# Patient Record
Sex: Female | Born: 1965 | Race: White | Hispanic: No | Marital: Married | Smoking: Never smoker
Health system: Southern US, Community
[De-identification: ages and names within clinical notes are randomized; demographics above are authoritative.]

## PROBLEM LIST (undated history)

## (undated) DIAGNOSIS — J45909 Unspecified asthma, uncomplicated: Secondary | ICD-10-CM

## (undated) DIAGNOSIS — E669 Obesity, unspecified: Secondary | ICD-10-CM

## (undated) DIAGNOSIS — R053 Chronic cough: Secondary | ICD-10-CM

## (undated) DIAGNOSIS — D259 Leiomyoma of uterus, unspecified: Secondary | ICD-10-CM

## (undated) DIAGNOSIS — K219 Gastro-esophageal reflux disease without esophagitis: Secondary | ICD-10-CM

## (undated) DIAGNOSIS — Z8719 Personal history of other diseases of the digestive system: Secondary | ICD-10-CM

## (undated) DIAGNOSIS — E785 Hyperlipidemia, unspecified: Secondary | ICD-10-CM

---

## 2008-04-22 ENCOUNTER — Other Ambulatory Visit: Payer: Self-pay

## 2008-04-22 ENCOUNTER — Emergency Department: Payer: Self-pay | Admitting: Emergency Medicine

## 2009-02-27 ENCOUNTER — Ambulatory Visit: Payer: Self-pay

## 2010-10-07 ENCOUNTER — Ambulatory Visit: Payer: Self-pay

## 2011-10-26 ENCOUNTER — Ambulatory Visit: Payer: Self-pay | Admitting: Obstetrics and Gynecology

## 2011-10-29 ENCOUNTER — Ambulatory Visit: Payer: Self-pay | Admitting: Obstetrics and Gynecology

## 2013-01-10 ENCOUNTER — Ambulatory Visit: Payer: Self-pay | Admitting: Obstetrics and Gynecology

## 2015-10-07 ENCOUNTER — Other Ambulatory Visit: Payer: Self-pay | Admitting: Obstetrics and Gynecology

## 2015-10-07 DIAGNOSIS — Z1239 Encounter for other screening for malignant neoplasm of breast: Secondary | ICD-10-CM

## 2016-03-03 ENCOUNTER — Other Ambulatory Visit: Payer: Self-pay | Admitting: Obstetrics and Gynecology

## 2016-03-03 DIAGNOSIS — Z1239 Encounter for other screening for malignant neoplasm of breast: Secondary | ICD-10-CM

## 2016-03-16 ENCOUNTER — Ambulatory Visit
Admission: RE | Admit: 2016-03-16 | Discharge: 2016-03-16 | Disposition: A | Discharge status: Home / Self Care | Payer: BLUE CROSS/BLUE SHIELD | Source: Ambulatory Visit | Attending: Obstetrics and Gynecology | Admitting: Obstetrics and Gynecology

## 2016-03-16 DIAGNOSIS — Z1239 Encounter for other screening for malignant neoplasm of breast: Secondary | ICD-10-CM

## 2016-03-16 DIAGNOSIS — Z1231 Encounter for screening mammogram for malignant neoplasm of breast: Secondary | ICD-10-CM | POA: Insufficient documentation

## 2016-10-20 ENCOUNTER — Other Ambulatory Visit: Payer: Self-pay | Admitting: Obstetrics and Gynecology

## 2016-10-20 DIAGNOSIS — Z1231 Encounter for screening mammogram for malignant neoplasm of breast: Secondary | ICD-10-CM

## 2016-12-27 HISTORY — PX: COLONOSCOPY WITH ESOPHAGOGASTRODUODENOSCOPY (EGD): SHX5779

## 2017-03-22 ENCOUNTER — Ambulatory Visit
Admission: RE | Admit: 2017-03-22 | Discharge: 2017-03-22 | Disposition: A | Discharge status: Home / Self Care | Payer: BLUE CROSS/BLUE SHIELD | Source: Ambulatory Visit | Attending: Obstetrics and Gynecology | Admitting: Obstetrics and Gynecology

## 2017-03-22 DIAGNOSIS — Z1231 Encounter for screening mammogram for malignant neoplasm of breast: Secondary | ICD-10-CM | POA: Diagnosis not present

## 2017-09-26 HISTORY — PX: COLONOSCOPY: SHX174

## 2018-04-12 ENCOUNTER — Other Ambulatory Visit: Payer: Self-pay | Admitting: Obstetrics and Gynecology

## 2018-04-12 DIAGNOSIS — Z1231 Encounter for screening mammogram for malignant neoplasm of breast: Secondary | ICD-10-CM

## 2018-05-09 ENCOUNTER — Ambulatory Visit
Admission: RE | Admit: 2018-05-09 | Discharge: 2018-05-09 | Disposition: A | Discharge status: Home / Self Care | Payer: BLUE CROSS/BLUE SHIELD | Source: Ambulatory Visit | Attending: Obstetrics and Gynecology | Admitting: Obstetrics and Gynecology

## 2018-05-09 DIAGNOSIS — Z1231 Encounter for screening mammogram for malignant neoplasm of breast: Secondary | ICD-10-CM

## 2019-07-12 ENCOUNTER — Other Ambulatory Visit: Payer: Self-pay | Admitting: Nurse Practitioner

## 2019-07-12 DIAGNOSIS — Z1231 Encounter for screening mammogram for malignant neoplasm of breast: Secondary | ICD-10-CM

## 2019-07-31 ENCOUNTER — Ambulatory Visit
Admission: RE | Admit: 2019-07-31 | Discharge: 2019-07-31 | Disposition: A | Discharge status: Home / Self Care | Payer: 59 | Source: Ambulatory Visit | Attending: Nurse Practitioner | Admitting: Nurse Practitioner

## 2019-07-31 ENCOUNTER — Other Ambulatory Visit: Payer: Self-pay

## 2019-07-31 ENCOUNTER — Encounter (INDEPENDENT_AMBULATORY_CARE_PROVIDER_SITE_OTHER): Payer: Self-pay

## 2019-07-31 DIAGNOSIS — Z1231 Encounter for screening mammogram for malignant neoplasm of breast: Secondary | ICD-10-CM | POA: Diagnosis present

## 2020-07-08 ENCOUNTER — Other Ambulatory Visit: Payer: Self-pay | Admitting: Nurse Practitioner

## 2020-07-08 DIAGNOSIS — Z1231 Encounter for screening mammogram for malignant neoplasm of breast: Secondary | ICD-10-CM

## 2020-08-12 ENCOUNTER — Other Ambulatory Visit: Payer: Self-pay

## 2020-08-12 ENCOUNTER — Ambulatory Visit
Admission: RE | Admit: 2020-08-12 | Discharge: 2020-08-12 | Disposition: A | Discharge status: Home / Self Care | Payer: 59 | Source: Ambulatory Visit | Attending: Nurse Practitioner | Admitting: Nurse Practitioner

## 2020-08-12 DIAGNOSIS — Z1231 Encounter for screening mammogram for malignant neoplasm of breast: Secondary | ICD-10-CM | POA: Diagnosis present

## 2021-05-19 ENCOUNTER — Other Ambulatory Visit: Payer: Self-pay | Admitting: Obstetrics and Gynecology

## 2021-06-17 ENCOUNTER — Other Ambulatory Visit: Payer: 59

## 2021-06-25 NOTE — H&P (Signed)
Joyce Stevenson is a 55 y.o. female here for PMB Scheduled for a TVH and bilateral salpingectomy .Marland Kitchen Full workup in 10/21  Negative SIS except for small fibroids   embx 9/21 - negative except for polyps  Pap 9/24 - neg  Spotted in 03/2021 and then in 04/26/21 start heavy bleeding ++ clots  G1P1 s/p svd   Past Medical History:  has a past medical history of Allergic rhinitis, Chronic cough, Onychomycosis, and Uterine fibroid (06/2020).  Past Surgical History:  has a past surgical history that includes Colonoscopy (01/11/2017); egd (01/11/2017); and Colonoscopy (10/18/2017). Family History: family history includes Breast cancer in her maternal aunt and maternal grandmother; High blood pressure (Hypertension) in her father and mother; Lung cancer in her father; Reflux disease in her father and mother. Social History:  reports that she has never smoked. She has never used smokeless tobacco. She reports that she does not drink alcohol and does not use drugs. OB/GYN History:          OB History     Gravida  1   Para  1   Term      Preterm      AB      Living  1      SAB      IAB      Ectopic      Molar      Multiple      Live Births  1             Allergies: has No Known Allergies. Medications:   Current Outpatient Medications:    esomeprazole (NEXIUM) 40 MG DR capsule, Take 1 capsule (40 mg total) by mouth once daily, Disp: 90 capsule, Rfl: 3   Lactobacillus acidophilus (PROBIOTIC ORAL), Take 1 capsule by mouth sometimes  , Disp: , Rfl:    multivitamin tablet, Take 1 tablet by mouth once daily, Disp: , Rfl:    tranexamic acid (LYSTEDA) 650 mg tablet, Take 2 tablets (1,300 mg total) by mouth 3 (three) times daily Take for a maximum of 5 days during monthly menstruation., Disp: 30 tablet, Rfl: 3   Review of Systems: General:                      No fatigue or weight loss Eyes:                           No vision changes Ears:                            No hearing  difficulty Respiratory:                No cough or shortness of breath Pulmonary:                  No asthma or shortness of breath Cardiovascular:           No chest pain, palpitations, dyspnea on exertion Gastrointestinal:          No abdominal bloating, chronic diarrhea, constipations, masses, pain or hematochezia Genitourinary:             No hematuria, dysuria, abnormal vaginal discharge, pelvic pain, Menometrorrhagia Lymphatic:                   No swollen lymph nodes Musculoskeletal:         No  muscle weakness Neurologic:                  No extremity weakness, syncope, seizure disorder Psychiatric:                  No history of depression, delusions or suicidal/homicidal ideation      Exam:       Vitals:  06/25/21   BP: (!) 147/84  Pulse: 84      Body mass index is 33.73 kg/m.   WDWN white/  female in NAD   Lungs: CTA  CV : RRR without murmur   Neck:  no thyromegaly Abdomen: soft , no mass, normal active bowel sounds,  non-tender, no rebound tenderness Pelvic: tanner stage 5 ,  External genitalia: vulva /labia no lesions Urethra: no prolapse Vagina: bloody d/c  Clots at cervical os  Adequate room for TVH , cx descend 1/2 down canal Cervix: no lesions, no cervical motion tenderness   Uterus: normal size shape and contour, non-tender Adnexa: no mass,  non-tender   Rectovaginal   Impression:    The primary encounter diagnosis was PMB (postmenopausal bleeding). A diagnosis of Menorrhagia with irregular cycle was also pertinent to this visit.       Plan:  Offered Fx D+C  With H/S and endometrial ablation . Pt elects for definitve sx TVH and bilat salpingectomy  I have explained the procedure and possible risks .  For now she with start Lysteda 1300 mg tid x 5 days               Caroline Sauger, MD

## 2021-07-02 ENCOUNTER — Other Ambulatory Visit: Payer: Self-pay | Admitting: Nurse Practitioner

## 2021-07-06 ENCOUNTER — Other Ambulatory Visit: Payer: Self-pay

## 2021-07-06 ENCOUNTER — Encounter
Admission: RE | Admit: 2021-07-06 | Discharge: 2021-07-06 | Disposition: A | Discharge status: Home / Self Care | Payer: Managed Care, Other (non HMO) | Source: Ambulatory Visit | Attending: Obstetrics and Gynecology | Admitting: Obstetrics and Gynecology

## 2021-07-06 HISTORY — DX: Leiomyoma of uterus, unspecified: D25.9

## 2021-07-06 NOTE — Patient Instructions (Addendum)
Your procedure is scheduled on: Wednesday, July 20 Report to the Registration Desk on the 1st floor of the [facility]. To find out your arrival time, please call [phone] between 1PM - 3PM on: Tuesday, July 19  REMEMBER: Instructions that are not followed completely may result in serious medical risk, up to and including death; or upon the discretion of your surgeon and anesthesiologist your surgery may need to be rescheduled.  Do not eat food after midnight the night before surgery.  No gum chewing, lozengers or hard candies.  You may however, drink CLEAR liquids up to 2 hours before you are scheduled to arrive for your surgery. Do not drink anything within 2 hours of your scheduled arrival time.  Clear liquids include: - water  - apple juice without pulp - gatorade (not RED, PURPLE, OR BLUE) - black coffee or tea (Do NOT add milk or creamers to the coffee or tea) Do NOT drink anything that is not on this list.  In addition, your doctor has ordered for you to drink the provided  Ensure Pre-Surgery Clear Carbohydrate Drink  Drinking this carbohydrate drink up to two hours before surgery helps to reduce insulin resistance and improve patient outcomes. Please complete drinking 2 hours prior to scheduled arrival time.  DO NOT TAKE ANY MEDICATIONS THE MORNING OF SURGERY  One week prior to surgery: STARTING July 13 Stop Anti-inflammatories (NSAIDS) such as Advil, Aleve, Ibuprofen, Motrin, Naproxen, Naprosyn and Aspirin based products such as Excedrin, Goodys Powder, BC Powder. Stop ANY OVER THE COUNTER supplements until after surgery. (STOP GINKGO, MULTI-VITAMIN, VITAMIN D) You may however, continue to take Tylenol if needed for pain up until the day of surgery.  No Alcohol for 24 hours before or after surgery.  No Smoking including e-cigarettes for 24 hours prior to surgery.  No chewable tobacco products for at least 6 hours prior to surgery.  No nicotine patches on the day of  surgery.  Do not use any "recreational" drugs for at least a week prior to your surgery.  Please be advised that the combination of cocaine and anesthesia may have negative outcomes, up to and including death. If you test positive for cocaine, your surgery will be cancelled.  On the morning of surgery brush your teeth with toothpaste and water, you may rinse your mouth with mouthwash if you wish. Do not swallow any toothpaste or mouthwash.  Do not wear jewelry, make-up, hairpins, clips or nail polish.  Do not wear lotions, powders, or perfumes.   Do not shave body from the neck down 48 hours prior to surgery just in case you cut yourself which could leave a site for infection.  Also, freshly shaved skin may become irritated if using the CHG soap.  Contact lenses, hearing aids and dentures may not be worn into surgery.  Do not bring valuables to the hospital. [facility] is not responsible for any missing/lost belongings or valuables.   Use CHG Soap as directed on instruction sheet.  Notify your doctor if there is any change in your medical condition (cold, fever, infection).  Wear comfortable clothing (specific to your surgery type) to the hospital.  After surgery, you can help prevent lung complications by doing breathing exercises.  Take deep breaths and cough every 1-2 hours. Your doctor may order a device called an Incentive Spirometer to help you take deep breaths. When coughing or sneezing, hold a pillow firmly against your incision with both hands. This is called "splinting." Doing this  helps protect your incision. It also decreases belly discomfort.  If you are being discharged the day of surgery, you will not be allowed to drive home. You will need a responsible adult (18 years or older) to drive you home and stay with you that night.   If you are taking public transportation, you will need to have a responsible adult (18 years or older) with you. Please confirm with your  physician that it is acceptable to use public transportation.   Please call the [place] Dept[facility] at [phone] if you have any questions about these instructions.  Surgery Visitation Policy:  Patients undergoing a surgery or procedure may have one family member or support person with them as long as that person is not COVID-19 positive or experiencing its symptoms.  That person may remain in the waiting area during the procedure.

## 2021-07-07 ENCOUNTER — Encounter
Admission: RE | Admit: 2021-07-07 | Discharge: 2021-07-07 | Disposition: A | Discharge status: Home / Self Care | Payer: Managed Care, Other (non HMO) | Source: Ambulatory Visit | Attending: Obstetrics and Gynecology | Admitting: Obstetrics and Gynecology

## 2021-07-07 DIAGNOSIS — Z01812 Encounter for preprocedural laboratory examination: Secondary | ICD-10-CM | POA: Diagnosis not present

## 2021-07-07 LAB — CBC
HCT: 44.2 % (ref 36.0–46.0)
Hemoglobin: 15.6 g/dL — ABNORMAL HIGH (ref 12.0–15.0)
MCH: 31.6 pg (ref 26.0–34.0)
MCHC: 35.3 g/dL (ref 30.0–36.0)
MCV: 89.5 fL (ref 80.0–100.0)
Platelets: 176 10*3/uL (ref 150–400)
RBC: 4.94 MIL/uL (ref 3.87–5.11)
RDW: 11.5 % (ref 11.5–15.5)
WBC: 5.8 10*3/uL (ref 4.0–10.5)
nRBC: 0 % (ref 0.0–0.2)

## 2021-07-07 LAB — TYPE AND SCREEN
ABO/RH(D): B POS
Antibody Screen: NEGATIVE

## 2021-07-07 LAB — BASIC METABOLIC PANEL
Anion gap: 7 (ref 5–15)
BUN: 12 mg/dL (ref 6–20)
CO2: 26 mmol/L (ref 22–32)
Calcium: 9.6 mg/dL (ref 8.9–10.3)
Chloride: 106 mmol/L (ref 98–111)
Creatinine, Ser: 0.88 mg/dL (ref 0.44–1.00)
GFR, Estimated: >60 mL/min (ref >60)
Glucose, Bld: 118 mg/dL — ABNORMAL HIGH (ref 70–99)
Potassium: 3.8 mmol/L (ref 3.5–5.1)
Sodium: 139 mmol/L (ref 135–145)

## 2021-07-10 ENCOUNTER — Other Ambulatory Visit: Payer: Self-pay | Admitting: Nurse Practitioner

## 2021-07-10 DIAGNOSIS — Z1231 Encounter for screening mammogram for malignant neoplasm of breast: Secondary | ICD-10-CM

## 2021-07-14 MED ORDER — ORAL CARE MOUTH RINSE: 15.0000 mL | Freq: Once | OROMUCOSAL | Status: DC

## 2021-07-14 MED ORDER — FAMOTIDINE 20 MG PO TABS
20.0000 mg | ORAL_TABLET | Freq: Once | ORAL | Status: AC
  Administered 2021-07-15: 20 mg via ORAL

## 2021-07-14 MED ORDER — ACETAMINOPHEN 500 MG PO TABS
1000.0000 mg | ORAL_TABLET | ORAL | Status: AC
  Administered 2021-07-15: 1000 mg via ORAL

## 2021-07-14 MED ORDER — POVIDONE-IODINE 10 % EX SWAB: 2.0000 "application " | Freq: Once | CUTANEOUS | Status: DC

## 2021-07-14 MED ORDER — CEFAZOLIN SODIUM-DEXTROSE 2-4 GM/100ML-% IV SOLN
2.0000 g | Freq: Once | INTRAVENOUS | Status: AC
  Administered 2021-07-15: 2 g via INTRAVENOUS

## 2021-07-14 MED ORDER — CHLORHEXIDINE GLUCONATE 0.12 % MT SOLN: 15.0000 mL | Freq: Once | OROMUCOSAL | Status: DC

## 2021-07-14 MED ORDER — GABAPENTIN 300 MG PO CAPS
300.0000 mg | ORAL_CAPSULE | ORAL | Status: AC
  Administered 2021-07-15: 300 mg via ORAL

## 2021-07-15 ENCOUNTER — Encounter
Admission: RE | Disposition: A | Discharge status: Home / Self Care | Payer: Self-pay | Source: Home / Self Care | Attending: Obstetrics and Gynecology

## 2021-07-15 ENCOUNTER — Other Ambulatory Visit: Payer: Self-pay

## 2021-07-15 ENCOUNTER — Ambulatory Visit: Payer: Managed Care, Other (non HMO) | Admitting: Anesthesiology

## 2021-07-15 ENCOUNTER — Ambulatory Visit
Admission: RE | Admit: 2021-07-15 | Discharge: 2021-07-15 | Disposition: A | Discharge status: Home / Self Care | Payer: Managed Care, Other (non HMO) | Attending: Obstetrics and Gynecology | Admitting: Obstetrics and Gynecology

## 2021-07-15 ENCOUNTER — Encounter: Payer: Self-pay | Admitting: Obstetrics and Gynecology

## 2021-07-15 DIAGNOSIS — N800 Endometriosis of uterus: Secondary | ICD-10-CM | POA: Insufficient documentation

## 2021-07-15 DIAGNOSIS — N950 Postmenopausal bleeding: Secondary | ICD-10-CM | POA: Diagnosis present

## 2021-07-15 DIAGNOSIS — D251 Intramural leiomyoma of uterus: Secondary | ICD-10-CM | POA: Diagnosis not present

## 2021-07-15 HISTORY — PX: BILATERAL SALPINGECTOMY: SHX5743

## 2021-07-15 HISTORY — PX: VAGINAL HYSTERECTOMY: SHX2639

## 2021-07-15 LAB — ABO/RH: ABO/RH(D): B POS

## 2021-07-15 LAB — POCT PREGNANCY, URINE: Preg Test, Ur: NEGATIVE

## 2021-07-15 SURGERY — HYSTERECTOMY, VAGINAL
Anesthesia: General

## 2021-07-15 MED ORDER — FENTANYL CITRATE (PF) 100 MCG/2ML IJ SOLN
INTRAMUSCULAR | Status: AC
  Filled 2021-07-15: qty 2

## 2021-07-15 MED ORDER — OXYCODONE-ACETAMINOPHEN 5-325 MG PO TABS: 1.0000 | ORAL_TABLET | ORAL | Status: DC | PRN

## 2021-07-15 MED ORDER — PHENYLEPHRINE HCL (PRESSORS) 10 MG/ML IV SOLN
INTRAVENOUS | Status: AC
  Filled 2021-07-15: qty 1

## 2021-07-15 MED ORDER — MIDAZOLAM HCL 2 MG/2ML IJ SOLN
INTRAMUSCULAR | Status: DC | PRN
  Administered 2021-07-15: 2 mg via INTRAVENOUS

## 2021-07-15 MED ORDER — DEXMEDETOMIDINE (PRECEDEX) IN NS 20 MCG/5ML (4 MCG/ML) IV SYRINGE
PREFILLED_SYRINGE | INTRAVENOUS | Status: DC | PRN
  Administered 2021-07-15: 8 ug via INTRAVENOUS

## 2021-07-15 MED ORDER — PROMETHAZINE HCL 25 MG/ML IJ SOLN
6.2500 mg | INTRAMUSCULAR | Status: DC | PRN
  Administered 2021-07-15: 12.5 mg via INTRAVENOUS

## 2021-07-15 MED ORDER — OXYCODONE HCL 5 MG PO TABS
5.0000 mg | ORAL_TABLET | Freq: Once | ORAL | Status: DC | PRN
Start: 2021-07-15 — End: 2021-07-15

## 2021-07-15 MED ORDER — PROPOFOL 10 MG/ML IV BOLUS
INTRAVENOUS | Status: DC | PRN
  Administered 2021-07-15: 50 mg via INTRAVENOUS
  Administered 2021-07-15: 200 mg via INTRAVENOUS

## 2021-07-15 MED ORDER — CHLORHEXIDINE GLUCONATE 0.12 % MT SOLN
OROMUCOSAL | Status: AC
  Filled 2021-07-15: qty 15

## 2021-07-15 MED ORDER — ONDANSETRON HCL 4 MG/2ML IJ SOLN
INTRAMUSCULAR | Status: DC | PRN
  Administered 2021-07-15: 4 mg via INTRAVENOUS

## 2021-07-15 MED ORDER — 0.9 % SODIUM CHLORIDE (POUR BTL) OPTIME
TOPICAL | Status: DC | PRN
  Administered 2021-07-15: 500 mL

## 2021-07-15 MED ORDER — PROMETHAZINE HCL 25 MG/ML IJ SOLN
INTRAMUSCULAR | Status: AC
  Filled 2021-07-15: qty 1

## 2021-07-15 MED ORDER — DEXAMETHASONE SODIUM PHOSPHATE 10 MG/ML IJ SOLN
INTRAMUSCULAR | Status: AC
  Filled 2021-07-15: qty 1

## 2021-07-15 MED ORDER — ACETAMINOPHEN 500 MG PO TABS
ORAL_TABLET | ORAL | Status: AC
  Filled 2021-07-15: qty 2

## 2021-07-15 MED ORDER — PROPOFOL 1000 MG/100ML IV EMUL
INTRAVENOUS | Status: AC
  Filled 2021-07-15: qty 100

## 2021-07-15 MED ORDER — CEFAZOLIN SODIUM-DEXTROSE 2-4 GM/100ML-% IV SOLN
INTRAVENOUS | Status: AC
  Filled 2021-07-15: qty 100

## 2021-07-15 MED ORDER — LIDOCAINE HCL (CARDIAC) PF 100 MG/5ML IV SOSY
PREFILLED_SYRINGE | INTRAVENOUS | Status: DC | PRN
  Administered 2021-07-15: 100 mg via INTRAVENOUS

## 2021-07-15 MED ORDER — FAMOTIDINE 20 MG PO TABS
ORAL_TABLET | ORAL | Status: AC
  Filled 2021-07-15: qty 1

## 2021-07-15 MED ORDER — DEXAMETHASONE SODIUM PHOSPHATE 10 MG/ML IJ SOLN
INTRAMUSCULAR | Status: DC | PRN
  Administered 2021-07-15: 10 mg via INTRAVENOUS

## 2021-07-15 MED ORDER — SUGAMMADEX SODIUM 200 MG/2ML IV SOLN
INTRAVENOUS | Status: DC | PRN
  Administered 2021-07-15: 200 mg via INTRAVENOUS

## 2021-07-15 MED ORDER — LIDOCAINE-EPINEPHRINE 1 %-1:100000 IJ SOLN
INTRAMUSCULAR | Status: DC | PRN
  Administered 2021-07-15: 7 mL

## 2021-07-15 MED ORDER — ONDANSETRON HCL 4 MG/2ML IJ SOLN
INTRAMUSCULAR | Status: AC
  Filled 2021-07-15: qty 2

## 2021-07-15 MED ORDER — OXYCODONE-ACETAMINOPHEN 5-325 MG PO TABS
ORAL_TABLET | ORAL | Status: AC
  Administered 2021-07-15: 2 via ORAL
  Filled 2021-07-15: qty 2

## 2021-07-15 MED ORDER — ROCURONIUM BROMIDE 100 MG/10ML IV SOLN
INTRAVENOUS | Status: DC | PRN
  Administered 2021-07-15: 40 mg via INTRAVENOUS

## 2021-07-15 MED ORDER — ONDANSETRON 4 MG PO TBDP
4.0000 mg | ORAL_TABLET | Freq: Four times a day (QID) | ORAL | Status: DC | PRN
Start: 2021-07-15 — End: 2021-07-15

## 2021-07-15 MED ORDER — LIDOCAINE-EPINEPHRINE 1 %-1:100000 IJ SOLN
INTRAMUSCULAR | Status: AC
  Filled 2021-07-15: qty 1

## 2021-07-15 MED ORDER — OXYCODONE HCL 5 MG/5ML PO SOLN: 5.0000 mg | Freq: Once | ORAL | Status: DC | PRN

## 2021-07-15 MED ORDER — SODIUM CHLORIDE FLUSH 0.9 % IV SOLN
INTRAVENOUS | Status: AC
  Filled 2021-07-15: qty 10

## 2021-07-15 MED ORDER — MIDAZOLAM HCL 2 MG/2ML IJ SOLN
INTRAMUSCULAR | Status: AC
  Filled 2021-07-15: qty 2

## 2021-07-15 MED ORDER — FENTANYL CITRATE (PF) 100 MCG/2ML IJ SOLN
INTRAMUSCULAR | Status: DC | PRN
  Administered 2021-07-15 (×2): 50 ug via INTRAVENOUS

## 2021-07-15 MED ORDER — ROCURONIUM BROMIDE 10 MG/ML (PF) SYRINGE
PREFILLED_SYRINGE | INTRAVENOUS | Status: AC
  Filled 2021-07-15: qty 10

## 2021-07-15 MED ORDER — FENTANYL CITRATE (PF) 100 MCG/2ML IJ SOLN
25.0000 ug | INTRAMUSCULAR | Status: DC | PRN
  Administered 2021-07-15: 25 ug via INTRAVENOUS

## 2021-07-15 MED ORDER — LACTATED RINGERS IV SOLN: INTRAVENOUS | Status: DC

## 2021-07-15 MED ORDER — GABAPENTIN 300 MG PO CAPS
ORAL_CAPSULE | ORAL | Status: AC
  Filled 2021-07-15: qty 1

## 2021-07-15 MED ORDER — LIDOCAINE HCL (PF) 2 % IJ SOLN
INTRAMUSCULAR | Status: AC
  Filled 2021-07-15: qty 5

## 2021-07-15 MED ORDER — OXYCODONE HCL 5 MG PO TABS
ORAL_TABLET | ORAL | Status: AC
  Filled 2021-07-15: qty 1

## 2021-07-15 SURGICAL SUPPLY — 41 items
BACTOSHIELD CHG 4% 4OZ (MISCELLANEOUS) ×1
BAG DRN RND TRDRP ANRFLXCHMBR (UROLOGICAL SUPPLIES) ×2
BAG URINE DRAIN 2000ML AR STRL (UROLOGICAL SUPPLIES) ×3 IMPLANT
CANISTER SUCT 1200ML W/VALVE (MISCELLANEOUS) ×3 IMPLANT
CATH FOLEY 2WAY  5CC 16FR (CATHETERS) ×1
CATH FOLEY 2WAY 5CC 16FR (CATHETERS) ×2
CATH ROBINSON RED A/P 16FR (CATHETERS) ×3 IMPLANT
CATH URTH 16FR FL 2W BLN LF (CATHETERS) ×2 IMPLANT
DRAPE PERI LITHO V/GYN (MISCELLANEOUS) ×3 IMPLANT
DRAPE SURG 17X11 SM STRL (DRAPES) ×3 IMPLANT
DRAPE UNDER BUTTOCK W/FLU (DRAPES) ×3 IMPLANT
ELECT REM PT RETURN 9FT ADLT (ELECTROSURGICAL) ×3
ELECTRODE REM PT RTRN 9FT ADLT (ELECTROSURGICAL) ×2 IMPLANT
GAUZE 4X4 16PLY ~~LOC~~+RFID DBL (SPONGE) ×9 IMPLANT
GLOVE SURG SYN 8.0 (GLOVE) ×3 IMPLANT
GOWN STRL REUS W/ TWL LRG LVL3 (GOWN DISPOSABLE) ×6 IMPLANT
GOWN STRL REUS W/ TWL XL LVL3 (GOWN DISPOSABLE) ×2 IMPLANT
GOWN STRL REUS W/TWL LRG LVL3 (GOWN DISPOSABLE) ×9
GOWN STRL REUS W/TWL XL LVL3 (GOWN DISPOSABLE) ×3
KIT PINK PAD W/HEAD ARE REST (MISCELLANEOUS) ×3
KIT PINK PAD W/HEAD ARM REST (MISCELLANEOUS) ×2 IMPLANT
KIT TURNOVER CYSTO (KITS) ×3 IMPLANT
LABEL OR SOLS (LABEL) ×3 IMPLANT
MANIFOLD NEPTUNE II (INSTRUMENTS) ×3 IMPLANT
NEEDLE HYPO 22GX1.5 SAFETY (NEEDLE) ×3 IMPLANT
NEEDLE HYPO 25X1 1.5 SAFETY (NEEDLE) ×3 IMPLANT
PACK BASIN MINOR ARMC (MISCELLANEOUS) ×3 IMPLANT
PAD OB MATERNITY 4.3X12.25 (PERSONAL CARE ITEMS) ×3 IMPLANT
PAD PREP 24X41 OB/GYN DISP (PERSONAL CARE ITEMS) ×3 IMPLANT
SCRUB CHG 4% DYNA-HEX 4OZ (MISCELLANEOUS) ×2 IMPLANT
SOL PREP PVP 2OZ (MISCELLANEOUS) ×3
SOLUTION PREP PVP 2OZ (MISCELLANEOUS) ×2 IMPLANT
SURGILUBE 2OZ TUBE FLIPTOP (MISCELLANEOUS) ×3 IMPLANT
SUT VIC AB 0 CT1 27 (SUTURE) ×9
SUT VIC AB 0 CT1 27XCR 8 STRN (SUTURE) ×6 IMPLANT
SUT VIC AB 0 CT1 36 (SUTURE) ×3 IMPLANT
SUT VIC AB 2-0 SH 27 (SUTURE) ×3
SUT VIC AB 2-0 SH 27XBRD (SUTURE) ×2 IMPLANT
SYR 10ML LL (SYRINGE) ×3 IMPLANT
SYR CONTROL 10ML LL (SYRINGE) ×3 IMPLANT
WATER STERILE IRR 1000ML POUR (IV SOLUTION) ×3 IMPLANT

## 2021-07-15 NOTE — Progress Notes (Signed)
Pt scheduled for TVH + BS for PMB . NPO except ensure low carb at 0430 All questions answered . Proceed

## 2021-07-15 NOTE — Anesthesia Postprocedure Evaluation (Signed)
Anesthesia Post Note  Patient: Joyce Stevenson  Procedure(s) Performed: HYSTERECTOMY VAGINAL BILATERAL SALPINGECTOMY (Bilateral)  Patient location during evaluation: PACU Anesthesia Type: General Level of consciousness: awake and alert Pain management: pain level controlled Vital Signs Assessment: post-procedure vital signs reviewed and stable Respiratory status: spontaneous breathing, nonlabored ventilation, respiratory function stable and patient connected to nasal cannula oxygen Cardiovascular status: blood pressure returned to baseline and stable Postop Assessment: no apparent nausea or vomiting Anesthetic complications: no   No notable events documented.   Last Vitals:  Vitals:   07/15/21 1245 07/15/21 1439  BP: 129/81 138/82  Pulse: (!) 59 60  Resp: (!) 27 14  Temp: (!) 36.4 C 36.5 C  SpO2: 95% 96%    Last Pain:  Vitals:   07/15/21 1439  TempSrc: Temporal  PainSc: [place]

## 2021-07-15 NOTE — Anesthesia Procedure Notes (Signed)
Procedure Name: Intubation Date/Time: 07/15/2021 8:37 AM Performed by: Aline Brochure, CRNA Pre-anesthesia Checklist: Patient identified, Patient being monitored, Timeout performed, Emergency Drugs available and Suction available Patient Re-evaluated:Patient Re-evaluated prior to induction Oxygen Delivery Method: Circle system utilized Preoxygenation: Pre-oxygenation with 100% oxygen Induction Type: IV induction Ventilation: Mask ventilation without difficulty Laryngoscope Size: Mac and 3 Grade View: Grade I Tube type: Oral Tube size: 7.0 mm Number of attempts: 1 Airway Equipment and Method: Stylet Placement Confirmation: ETT inserted through vocal cords under direct vision, positive ETCO2 and breath sounds checked- equal and bilateral Secured at: 22 cm Tube secured with: Tape Dental Injury: Teeth and Oropharynx as per pre-operative assessment

## 2021-07-15 NOTE — Brief Op Note (Signed)
07/15/2021  10:02 AM  PATIENT:  Joyce Stevenson  55 y.o. female  PRE-OPERATIVE DIAGNOSIS:  post menopausal bleeding, menorrhagia  POST-OPERATIVE DIAGNOSIS:  post menopausal bleeding,menorrhagia  PROCEDURE:  Procedure(s): HYSTERECTOMY VAGINAL (N/A) BILATERAL SALPINGECTOMY (Bilateral)  SURGEON:  Surgeon(s) and Role:    * Nahima Ales, Gwen Her, MD - Primary    * Benjaman Kindler, MD - Assisting  PHYSICIAN ASSISTANT:   ASSISTANTS: cst   ANESTHESIA:   general  EBL:  50 mL   BLOOD ADMINISTERED:none  DRAINS: none   LOCAL MEDICATIONS USED:  LIDOCAINE   SPECIMEN:  Source of Specimen:  cervix , uterus and bilateral fallopian tubes   DISPOSITION OF SPECIMEN:  PATHOLOGY  COUNTS:  YES  TOURNIQUET:  * No tourniquets in log *  DICTATION: .Other Dictation: Dictation Number verbal  PLAN OF CARE: Discharge to home after PACU  PATIENT DISPOSITION:  PACU - hemodynamically stable.   Delay start of Pharmacological VTE agent (>24hrs) due to surgical blood loss or risk of bleeding: not applicable

## 2021-07-15 NOTE — Discharge Instructions (Signed)
AMBULATORY SURGERY  ?DISCHARGE INSTRUCTIONS ? ? ?The drugs that you were given will stay in your system until tomorrow so for the next 24 hours you should not: ? ?Drive an automobile ?Make any legal decisions ?Drink any alcoholic beverage ? ? ?You may resume regular meals tomorrow.  Today it is better to start with liquids and gradually work up to solid foods. ? ?You may eat anything you prefer, but it is better to start with liquids, then soup and crackers, and gradually work up to solid foods. ? ? ?Please notify your doctor immediately if you have any unusual bleeding, trouble breathing, redness and pain at the surgery site, drainage, fever, or pain not relieved by medication. ? ? ? ?Additional Instructions: ? ? ? ?Please contact your physician with any problems or Same Day Surgery at [phone], Monday through Friday 6 am to 4 pm, or [place] [facility] [place] [facility] number at [phone].  ?

## 2021-07-15 NOTE — Op Note (Signed)
Joyce Stevenson, LIZANA MEDICAL RECORD NO: [id number] ACCOUNT NO: [id number] DATE OF BIRTH: 1966-07-18 FACILITY: [facility] LOCATION: [facility] PHYSICIAN: Boykin Nearing, MD  Operative Report   DATE OF PROCEDURE: 07/15/2021  PREOPERATIVE DIAGNOSES: 1.  Postmenopausal bleeding. 2.  Menorrhagia.  POSTOPERATIVE DIAGNOSES:  1.  Postmenopausal bleeding. 2.  Menorrhagia.  PROCEDURE PERFORMED:  Total vaginal hysterectomy, bilateral salpingectomy.  SURGEON:  Laverta Baltimore, MD  FIRST ASSISTANT:  Benjaman Kindler, MD  ANESTHESIA:  General endotracheal anesthesia.  INDICATION:  A 55 year old female with postmenopausal bleeding with continued heavy menstrual cycles despite adequate workup.  The patient declines hysteroscopy and NovaSure ablation and wishes definitive surgery.  DESCRIPTION OF PROCEDURE:  After adequate general endotracheal anesthesia, the patient was placed in dorsal supine position, legs placed in the candy cane stirrups.  The patient's abdomen, perineum and vagina were prepped and draped in normal sterile  fashion.  Timeout was performed.  The patient did receive 2 grams of IV Ancef prior to commencement of the case.  Straight catheterization of the bladder yielded 125 mL clear urine.  A weighted speculum was placed in the posterior vagina and the cervix  was grasped with 2 thyroid tenacula.  Cervix was then circumferentially injected with 0.5% lidocaine with 1:100,000 epinephrine.  A direct posterior colpotomy incision was made, upon entry into the posterior cul-de-sac, the peritoneal edge was tagged  with a suture and a long billed weighted speculum was placed.  The uterosacral ligaments were bilaterally clamped, transected and suture ligated with 0 Vicryl suture.  The anterior cervix was circumferentially incised with the Bovie.  The cardinal  ligaments were then bilaterally clamped, transected and suture ligated with 0 Vicryl suture.  The anterior cul-de-sac was  entered without difficulty and the Deaver retractor was placed within to reflect the bladder anteriorly.  The uterine arteries were  then bilaterally clamped, transected and suture ligated with 0 Vicryl suture.  Sequential clamping of the broad ligament to the level of the cornua then ensued.  The cornua were then bilaterally clamped, transected and each pedicle was doubly ligated  with 0 Vicryl suture.  Of note, the cervix and uterus was decompressed and cored out with the Bovie to aid for identification of the bilateral cornua.  Each fallopian tube was grasped with a Babcock clamp and distal portion of the fallopian tubes were  removed and each pedicle was ligated with 0 Vicryl suture.  Good hemostasis noted.  Ovaries appeared normal.  Good hemostasis noted before the cornual pedicles were cut.  The peritoneum was then closed in a pursestring fashion with 2-0 PDS and the  vaginal cuff was closed with a running 0 Vicryl suture with plication of the uterosacral ligaments centrally and the rest of the cuff was closed with the running 0 Vicryl suture.  Good hemostasis was noted.  There were no complications.  ESTIMATED BLOOD LOSS:  50 mL.  URINE OUTPUT:  125 mL and a repeat catheterization, 25 mL at the end of the case, equals 150 mL.  INTRAOPERATIVE FLUIDS:  1000 mL.  The patient did receive 30 mg intravenous Toradol at the end of the case and was taken to recovery room in good condition.   SHW D: 07/15/2021 10:58:10 am T: 07/15/2021 11:20:00 am  JOB: [id number]/ [id number]

## 2021-07-15 NOTE — Transfer of Care (Signed)
Immediate Anesthesia Transfer of Care Note  Patient: Joyce Stevenson  Procedure(s) Performed: HYSTERECTOMY VAGINAL BILATERAL SALPINGECTOMY (Bilateral)  Patient Location: PACU  Anesthesia Type:General  Level of Consciousness: drowsy  Airway & Oxygen Therapy: Patient Spontanous Breathing and Patient connected to face mask oxygen  Post-op Assessment: Post -op Vital signs reviewed and stable  Post vital signs: stable  Last Vitals:  Vitals Value Taken Time  BP 140/85   Temp    Pulse 81 07/15/21 1019  Resp 27 07/15/21 1019  SpO2 99 % 07/15/21 1019  Vitals shown include unvalidated device data.  Last Pain:  Vitals:   07/15/21 0706  TempSrc: Oral  PainSc: 0-No pain         Complications: No notable events documented.

## 2021-07-15 NOTE — Anesthesia Preprocedure Evaluation (Addendum)
Anesthesia Evaluation  Patient identified by MRN, date of birth, ID band Patient awake    Reviewed: Allergy & Precautions, NPO status , Patient's Chart, lab work & pertinent test results  Airway Mallampati: II  TM Distance: >3 FB Neck ROM: full    Dental no notable dental hx.    Pulmonary  Chronic cough   Pulmonary exam normal        Cardiovascular negative cardio ROS Normal cardiovascular exam     Neuro/Psych negative neurological ROS  negative psych ROS   GI/Hepatic negative GI ROS, Neg liver ROS,   Endo/Other  negative endocrine ROS  Renal/GU negative Renal ROS  negative genitourinary   Musculoskeletal negative musculoskeletal ROS (+)   Abdominal (+) + obese,   Peds negative pediatric ROS (+)  Hematology negative hematology ROS (+)   Anesthesia Other Findings Past Medical History: No date: Uterine fibroid  Past Surgical History: 09/2017: COLONOSCOPY 12/2016: COLONOSCOPY WITH ESOPHAGOGASTRODUODENOSCOPY (EGD)  BMI    Body Mass Index: 32.93 kg/m     Chronic coug   Reproductive/Obstetrics negative OB ROS                            Anesthesia Physical Anesthesia Plan  ASA: 2  Anesthesia Plan: General ETT   Post-op Pain Management:    Induction: Intravenous  PONV Risk Score and Plan: 3 and Ondansetron, Dexamethasone, Midazolam and Treatment may vary due to age or medical condition  Airway Management Planned: Oral ETT  Additional Equipment: None  Intra-op Plan:   Post-operative Plan: Extubation in OR  Informed Consent: I have reviewed the patients History and Physical, chart, labs and discussed the procedure including the risks, benefits and alternatives for the proposed anesthesia with the patient or authorized representative who has indicated his/her understanding and acceptance.     Dental Advisory Given  Plan Discussed with: Anesthesiologist, CRNA and  Surgeon  Anesthesia Plan Comments: (Patient consented for risks of anesthesia including but not limited to:  - adverse reactions to medications - damage to eyes, teeth, lips or other oral mucosa - nerve damage due to positioning  - sore throat or hoarseness - Damage to heart, brain, nerves, lungs, other parts of body or loss of life  Patient voiced understanding.)        Anesthesia Quick Evaluation

## 2021-07-16 ENCOUNTER — Encounter: Payer: Self-pay | Admitting: Obstetrics and Gynecology

## 2021-07-17 LAB — SURGICAL PATHOLOGY

## 2021-08-13 ENCOUNTER — Other Ambulatory Visit: Payer: Self-pay

## 2021-08-13 ENCOUNTER — Ambulatory Visit
Admission: RE | Admit: 2021-08-13 | Discharge: 2021-08-13 | Disposition: A | Discharge status: Home / Self Care | Payer: Managed Care, Other (non HMO) | Source: Ambulatory Visit | Attending: Nurse Practitioner | Admitting: Nurse Practitioner

## 2021-08-13 DIAGNOSIS — Z1231 Encounter for screening mammogram for malignant neoplasm of breast: Secondary | ICD-10-CM | POA: Insufficient documentation

## 2022-07-24 IMAGING — MG MM DIGITAL SCREENING BILAT W/ TOMO AND CAD
8 series · 8 of 24 positions shown · non-contrast
Comparison: Previous exam(s).

CLINICAL DATA: Screening.

EXAM:
DIGITAL SCREENING BILATERAL MAMMOGRAM WITH TOMOSYNTHESIS AND CAD
TECHNIQUE: Bilateral screening digital craniocaudal and mediolateral oblique
mammograms were obtained. Bilateral screening digital breast
tomosynthesis was performed. The images were evaluated with
computer-aided detection.

[R CC synth-2D]
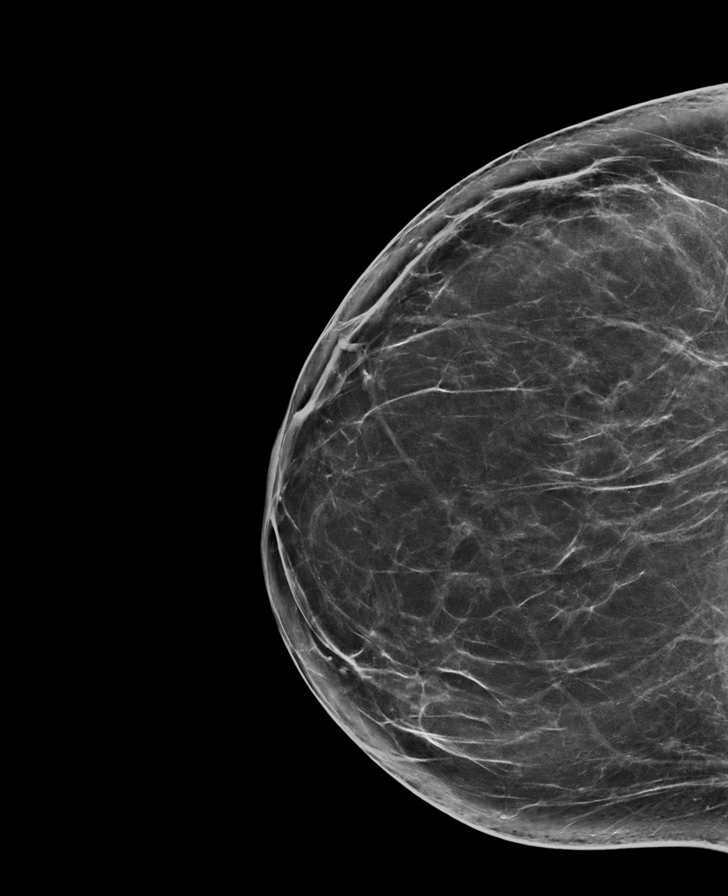

[L CC synth-2D]
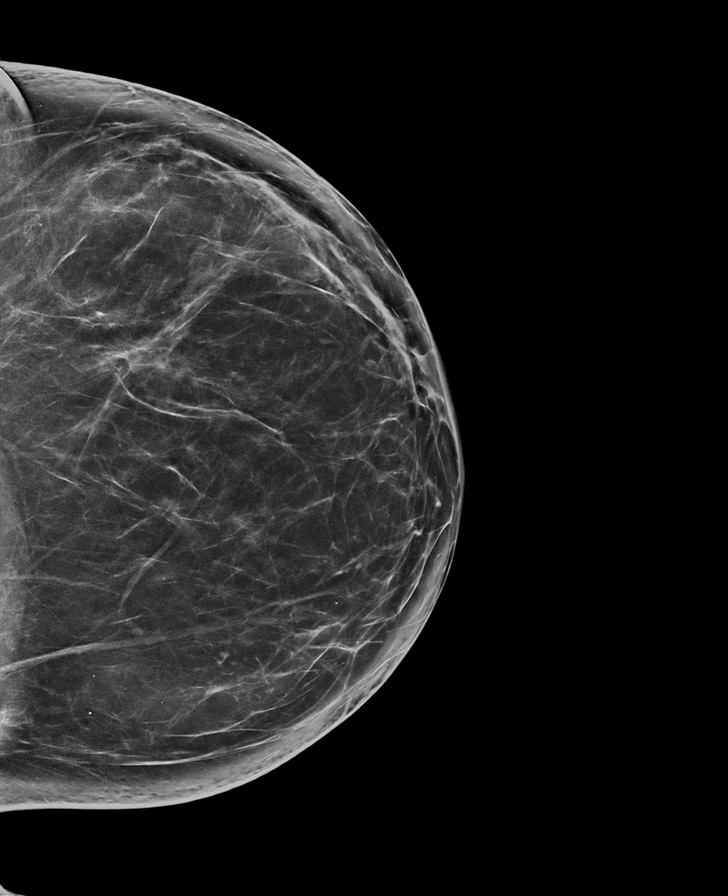

[L MLO synth-2D]
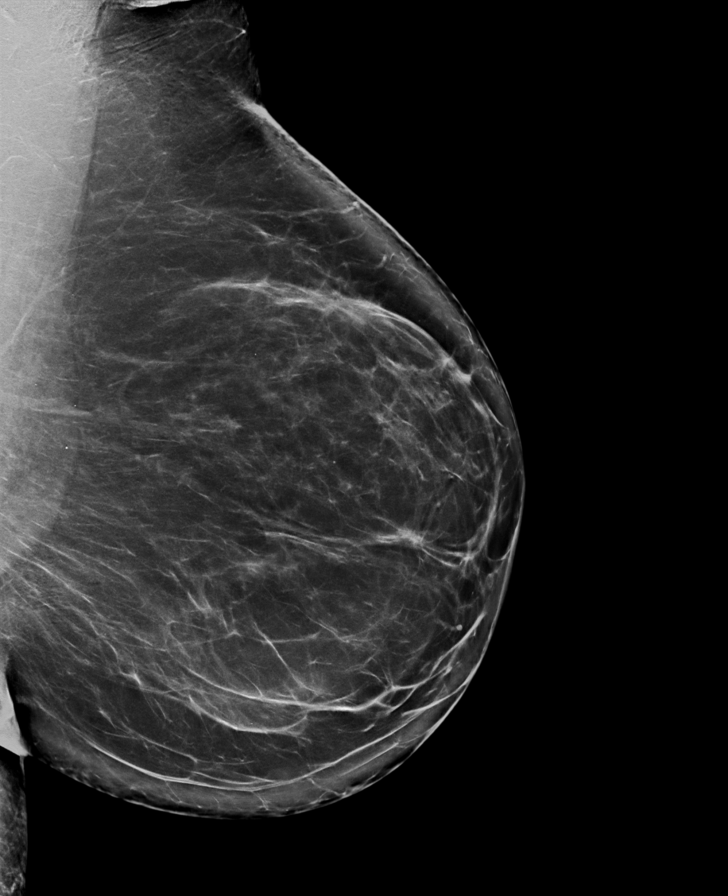

[R MLO synth-2D]
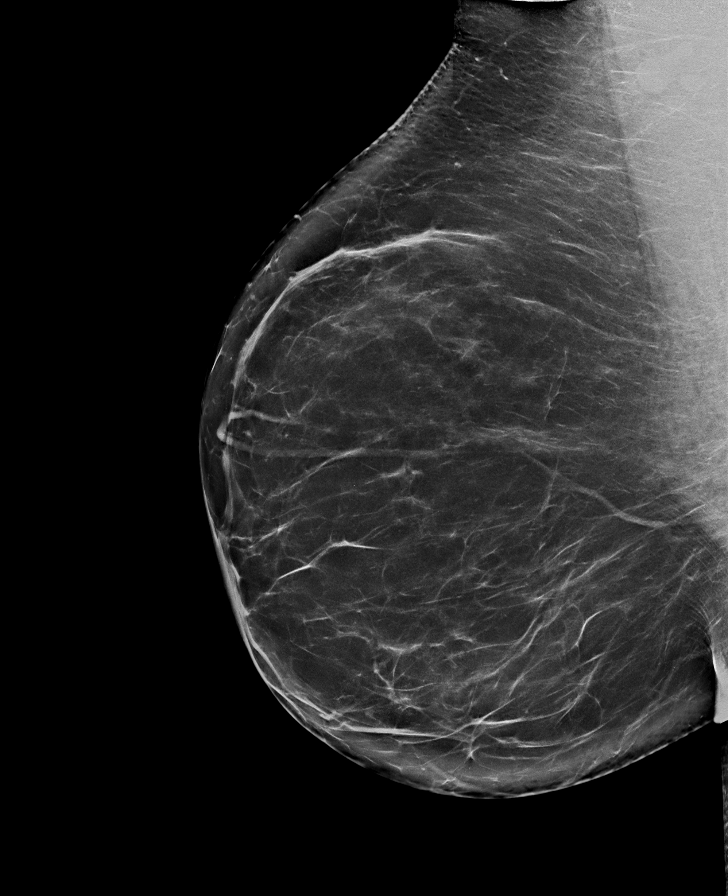

[R MLO tomo · tomo slice 51/101.0]
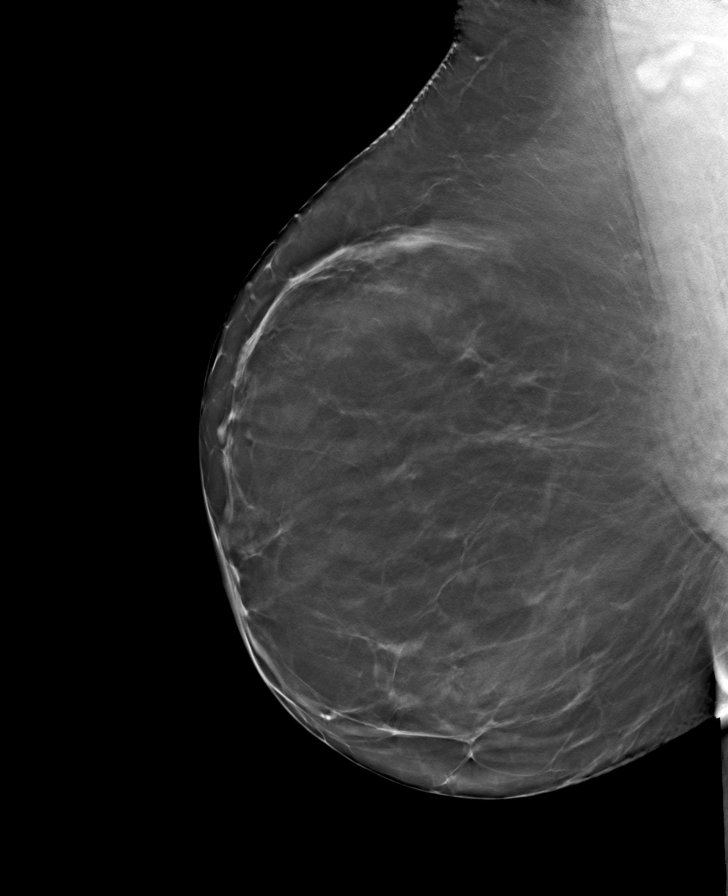

[L CC tomo · tomo slice 45/89.0]
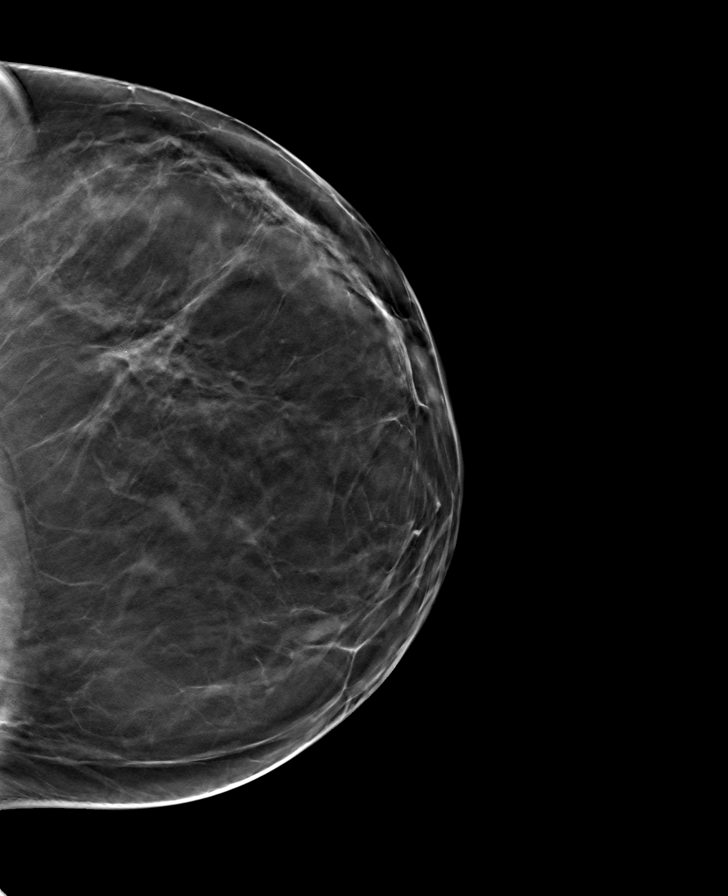

[L MLO tomo · tomo slice 51/100.0]
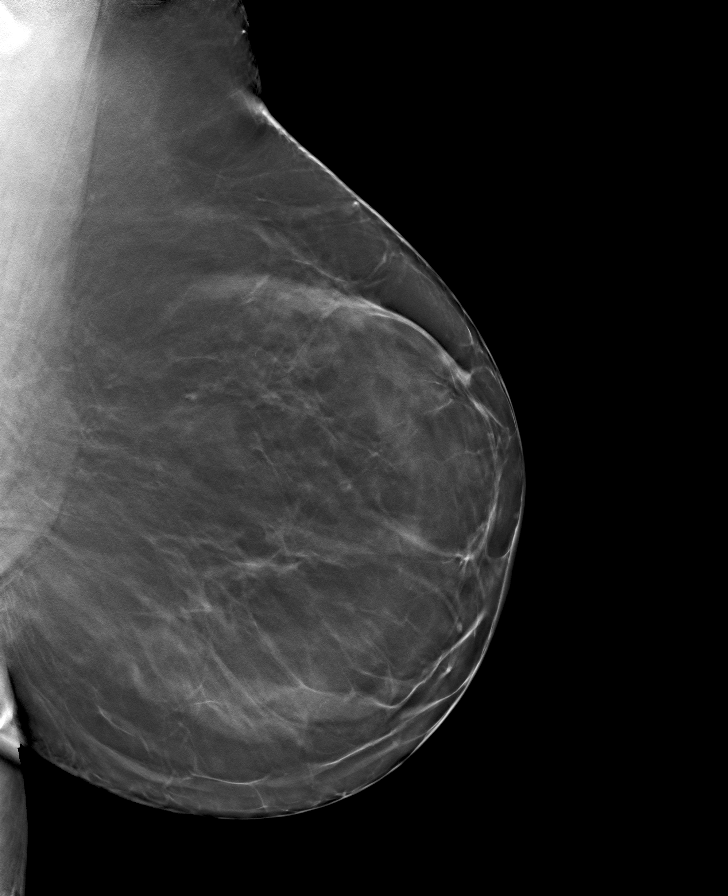

[R CC tomo · tomo slice 45/89.0]
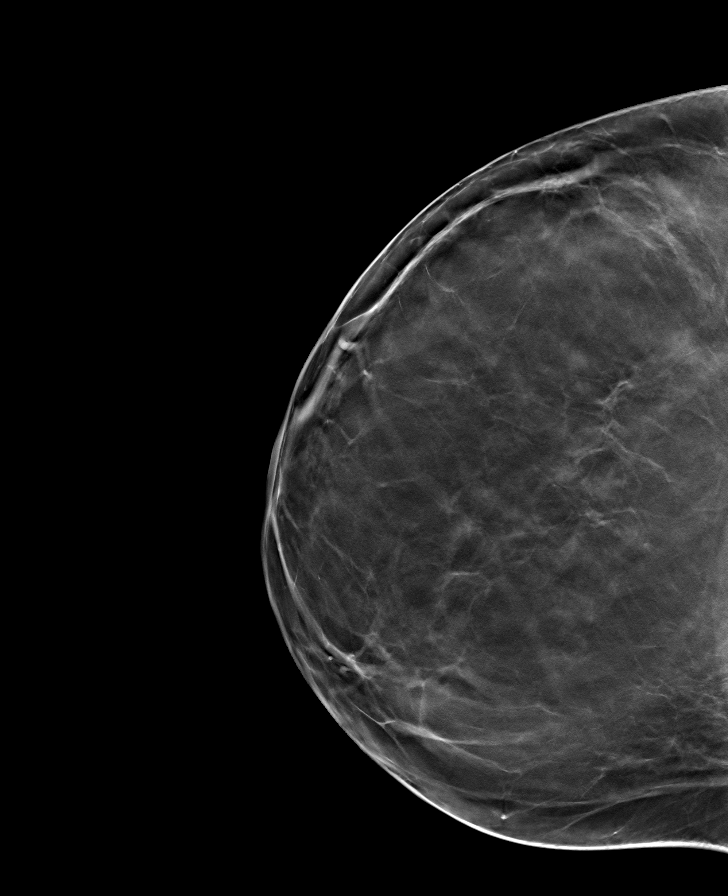

[8 of 24 positions shown; findings below may reference images not displayed]

ACR Breast Density Category b: There are scattered areas of
fibroglandular density.
FINDINGS: There are no findings suspicious for malignancy.
IMPRESSION: No mammographic evidence of malignancy. A result letter of this
screening mammogram will be mailed directly to the patient.

RECOMMENDATION:
Screening mammogram in one year. (Code:51-O-LD2)

BI-RADS CATEGORY  1: Negative.

## 2022-07-26 ENCOUNTER — Other Ambulatory Visit: Payer: Self-pay | Admitting: Nurse Practitioner

## 2022-07-26 DIAGNOSIS — Z1231 Encounter for screening mammogram for malignant neoplasm of breast: Secondary | ICD-10-CM

## 2022-09-01 ENCOUNTER — Ambulatory Visit
Admission: RE | Admit: 2022-09-01 | Discharge: 2022-09-01 | Disposition: A | Discharge status: Home / Self Care | Payer: BC Managed Care – PPO | Source: Ambulatory Visit | Attending: Nurse Practitioner | Admitting: Nurse Practitioner

## 2022-09-01 DIAGNOSIS — Z1231 Encounter for screening mammogram for malignant neoplasm of breast: Secondary | ICD-10-CM | POA: Insufficient documentation

## 2022-09-06 ENCOUNTER — Other Ambulatory Visit: Payer: Self-pay | Admitting: Nurse Practitioner

## 2022-09-06 DIAGNOSIS — N63 Unspecified lump in unspecified breast: Secondary | ICD-10-CM

## 2022-09-06 DIAGNOSIS — R928 Other abnormal and inconclusive findings on diagnostic imaging of breast: Secondary | ICD-10-CM

## 2022-09-08 ENCOUNTER — Ambulatory Visit
Admission: RE | Admit: 2022-09-08 | Discharge: 2022-09-08 | Disposition: A | Discharge status: Home / Self Care | Payer: BC Managed Care – PPO | Source: Ambulatory Visit | Attending: Nurse Practitioner | Admitting: Nurse Practitioner

## 2022-09-08 ENCOUNTER — Other Ambulatory Visit: Payer: Self-pay | Admitting: Nurse Practitioner

## 2022-09-08 DIAGNOSIS — R928 Other abnormal and inconclusive findings on diagnostic imaging of breast: Secondary | ICD-10-CM

## 2022-09-08 DIAGNOSIS — N63 Unspecified lump in unspecified breast: Secondary | ICD-10-CM | POA: Diagnosis present

## 2023-03-04 ENCOUNTER — Ambulatory Visit (INDEPENDENT_AMBULATORY_CARE_PROVIDER_SITE_OTHER): Payer: BC Managed Care – PPO

## 2023-03-04 DIAGNOSIS — K640 First degree hemorrhoids: Secondary | ICD-10-CM | POA: Diagnosis not present

## 2023-03-04 DIAGNOSIS — K219 Gastro-esophageal reflux disease without esophagitis: Secondary | ICD-10-CM | POA: Diagnosis not present

## 2023-03-04 DIAGNOSIS — Z1211 Encounter for screening for malignant neoplasm of colon: Secondary | ICD-10-CM | POA: Diagnosis present

## 2023-03-04 DIAGNOSIS — K222 Esophageal obstruction: Secondary | ICD-10-CM | POA: Diagnosis not present

## 2023-09-07 ENCOUNTER — Other Ambulatory Visit: Payer: Self-pay | Admitting: Nurse Practitioner

## 2023-09-07 DIAGNOSIS — Z1231 Encounter for screening mammogram for malignant neoplasm of breast: Secondary | ICD-10-CM

## 2023-09-19 ENCOUNTER — Other Ambulatory Visit: Payer: Self-pay | Admitting: Pulmonary Disease

## 2023-09-19 DIAGNOSIS — R053 Chronic cough: Secondary | ICD-10-CM

## 2023-09-28 ENCOUNTER — Ambulatory Visit: Payer: BC Managed Care – PPO | Attending: Pulmonary Disease

## 2023-09-28 DIAGNOSIS — R053 Chronic cough: Secondary | ICD-10-CM | POA: Diagnosis present

## 2023-09-28 LAB — PULMONARY FUNCTION TEST ARMC ONLY
FEF 25-75 Post: 1.33 L/s
FEF 25-75 Pre: 2.71 L/s
FEF2575-%Change-Post: -50 %
FEF2575-%Pred-Post: 51 %
FEF2575-%Pred-Pre: 104 %
FEV1-%Change-Post: -14 %
FEV1-%Pred-Post: 80 %
FEV1-%Pred-Pre: 94 %
FEV1-Post: 2.27 L
FEV1-Pre: 2.66 L
FEV1FVC-%Change-Post: -3 %
FEV1FVC-%Pred-Pre: 104 %
FEV6-%Change-Post: -11 %
FEV6-%Pred-Post: 81 %
FEV6-%Pred-Pre: 92 %
FEV6-Post: 2.88 L
FEV6-Pre: 3.25 L
FEV6FVC-%Pred-Post: 103 %
FEV6FVC-%Pred-Pre: 103 %
FVC-%Change-Post: -11 %
FVC-%Pred-Post: 79 %
FVC-%Pred-Pre: 89 %
FVC-Post: 2.88 L
FVC-Pre: 3.25 L
Post FEV1/FVC ratio: 79 %
Post FEV6/FVC ratio: 100 %
Pre FEV1/FVC ratio: 82 %
Pre FEV6/FVC Ratio: 100 %

## 2023-09-28 MED ORDER — ALBUTEROL SULFATE (2.5 MG/3ML) 0.083% IN NEBU
2.5000 mg | INHALATION_SOLUTION | Freq: Once | RESPIRATORY_TRACT | Status: AC
  Administered 2023-09-28: 2.5 mg via RESPIRATORY_TRACT
  Filled 2023-09-28: qty 3

## 2023-09-28 MED ORDER — METHACHOLINE 0.25 MG/ML NEB SOLN
3.0000 mL | Freq: Once | RESPIRATORY_TRACT | Status: AC
  Administered 2023-09-28: 0.75 mg via RESPIRATORY_TRACT
  Filled 2023-09-28: qty 3

## 2023-09-28 MED ORDER — METHACHOLINE 16 MG/ML NEB SOLN
3.0000 mL | Freq: Once | RESPIRATORY_TRACT | Status: AC
  Administered 2023-09-28: 48 mg via RESPIRATORY_TRACT
  Filled 2023-09-28: qty 3

## 2023-09-28 MED ORDER — METHACHOLINE 1 MG/ML NEB SOLN
3.0000 mL | Freq: Once | RESPIRATORY_TRACT | Status: AC
  Administered 2023-09-28: 3 mg via RESPIRATORY_TRACT
  Filled 2023-09-28: qty 3

## 2023-09-28 MED ORDER — METHACHOLINE 4 MG/ML NEB SOLN
3.0000 mL | Freq: Once | RESPIRATORY_TRACT | Status: AC
  Administered 2023-09-28: 12 mg via RESPIRATORY_TRACT
  Filled 2023-09-28: qty 3

## 2023-09-28 MED ORDER — METHACHOLINE 0 MG/ML NEB SOLN
3.0000 mL | Freq: Once | RESPIRATORY_TRACT | Status: AC
  Administered 2023-09-28: 3 mL via RESPIRATORY_TRACT
  Filled 2023-09-28: qty 3

## 2023-09-28 MED ORDER — METHACHOLINE 0.0625 MG/ML NEB SOLN
3.0000 mL | Freq: Once | RESPIRATORY_TRACT | Status: AC
  Administered 2023-09-28: 0.1875 mg via RESPIRATORY_TRACT
  Filled 2023-09-28: qty 3

## 2023-10-17 ENCOUNTER — Ambulatory Visit (INDEPENDENT_AMBULATORY_CARE_PROVIDER_SITE_OTHER): Payer: BC Managed Care – PPO | Admitting: Surgery

## 2023-10-17 ENCOUNTER — Encounter: Payer: Self-pay | Admitting: Surgery

## 2023-10-17 VITALS — BP 137/82 | HR 74 | Temp 98.0°F | Ht 66.0 in | Wt 200.0 lb

## 2023-10-17 DIAGNOSIS — K219 Gastro-esophageal reflux disease without esophagitis: Secondary | ICD-10-CM | POA: Diagnosis not present

## 2023-10-17 DIAGNOSIS — K449 Diaphragmatic hernia without obstruction or gangrene: Secondary | ICD-10-CM

## 2023-10-17 NOTE — Patient Instructions (Signed)
We will get you scheduled for a CT scan of the abdomen and pelvis.  We will also get you scheduled for a Barium Swallow.  We will call you about these studies.   We will have you follow up here after we get the results back from these studies.   Laparoscopic Nissen Fundoplication  Laparoscopic Nissen fundoplication is a surgery to relieve heartburn and other problems caused by fluid from your stomach (gastric fluids) flowing up into your esophagus. The esophagus is the part of the body that moves food from the mouth to the stomach. Normally, the muscle that sits between the stomach and the esophagus (lower esophageal sphincter, LES) keeps stomach fluids in the stomach. In some people, the LES does not work properly, and stomach fluids flow up into the esophagus (reflux). This can happen when part of the stomach bulges through the LES (hiatal hernia). The backward flow of stomach fluids can cause a type of severe and long-lasting heartburn that is called gastroesophageal reflux disease (GERD). You may need this surgery if other treatments for GERD have not helped. In this procedure, the upper part of your stomach is wrapped around the lower end of your esophagus and stitched together (sutured). This tightens the connection between your esophagus and stomach to prevent stomach acid reflux. Tell a health care provider about: Any allergies you have. All medicines you are taking, including vitamins, herbs, eye drops, creams, and over-the-counter medicines. Any problems you or family members have had with anesthetic medicines. Any blood disorders you have. Any surgeries you have had. Any medical conditions you have. Whether you are pregnant or may be pregnant. What are the risks? Generally, this is a safe procedure. However, problems may occur, including: Infection. Bleeding. Damage to other structures or organs. This can include damage to the lung, causing a collapsed lung. Trouble swallowing  (dysphagia). Blood clots. Allergic reactions to medicines.    Hiatal Hernia  A hiatal hernia occurs when part of the stomach slides above the muscle that separates the abdomen from the chest (diaphragm). A person can be born with a hiatal hernia (congenital), or it may develop over time. In almost all cases of hiatal hernia, only the top part of the stomach pushes through the diaphragm. Many people have a hiatal hernia with no symptoms. The larger the hernia, the more likely it is that you will have symptoms. In some cases, a hiatal hernia allows stomach acid to flow back into the tube that carries food from your mouth to your stomach (esophagus). This may cause heartburn symptoms. The development of heartburn symptoms may mean that you have a condition called gastroesophageal reflux disease (GERD). What are the causes? This condition is caused by a weakness in the opening (hiatus) where the esophagus passes through the diaphragm to attach to the upper part of the stomach. A person may be born with a weakness in the hiatus, or a weakness can develop over time. What increases the risk? This condition is more likely to develop in: Older people. Age is a major risk factor for a hiatal hernia, especially if you are over the age of 91. Pregnant women. People who are overweight. People who have frequent constipation. What are the signs or symptoms? Symptoms of this condition usually develop in the form of GERD symptoms. Symptoms include: Heartburn. Upset stomach (indigestion). Trouble swallowing. Coughing or wheezing. Wheezing is making high-pitched whistling sounds when you breathe. Sore throat. Chest pain. Nausea and vomiting. How is this diagnosed? This condition  may be diagnosed during testing for GERD. Tests that may be done include: X-rays of your stomach or chest. An upper gastrointestinal (GI) series. This is an X-ray exam of your GI tract that is taken after you swallow a chalky  liquid that shows up clearly on the X-ray. Endoscopy. This is a procedure to look into your stomach using a thin, flexible tube that has a tiny camera and light on the end of it. How is this treated? This condition may be treated by: Dietary and lifestyle changes to help reduce GERD symptoms. Medicines. These may include: Over-the-counter antacids. Medicines that make your stomach empty more quickly. Medicines that block the production of stomach acid (H2 blockers). Stronger medicines to reduce stomach acid (proton pump inhibitors). Surgery to repair the hernia, if other treatments are not helping. If you have no symptoms, you may not need treatment. Follow these instructions at home: Lifestyle and activity Do not use any products that contain nicotine or tobacco. These products include cigarettes, chewing tobacco, and vaping devices, such as e-cigarettes. If you need help quitting, ask your health care provider. Try to achieve and maintain a healthy body weight. Avoid putting pressure on your abdomen. Anything that puts pressure on your abdomen increases the amount of acid that may be pushed up into your esophagus. Avoid bending over, especially after eating. Raise the head of your bed by putting blocks under the legs. This keeps your head and esophagus higher than your stomach. Do not wear tight clothing around your chest or stomach. Try not to strain when having a bowel movement, when urinating, or when lifting heavy objects. Eating and drinking Avoid foods that can worsen GERD symptoms. These may include: Fatty foods, like fried foods. Citrus fruits, like oranges or lemon. Other foods and drinks that contain acid, like orange juice or tomatoes. Spicy food. Chocolate. Eat frequent small meals instead of three large meals a day. This helps prevent your stomach from getting too full. Eat slowly. Do not lie down right after eating. Do not eat 1-2 hours before bed. Do not drink  beverages with caffeine. These include cola, coffee, cocoa, and tea. Do not drink alcohol. General instructions Take over-the-counter and prescription medicines only as told by your health care provider. Keep all follow-up visits. Your health care provider will want to check that any new prescribed medicines are helping your symptoms. Contact a health care provider if: Your symptoms are not controlled with medicines or lifestyle changes. You are having trouble swallowing. You have coughing or wheezing that will not go away. Your pain is getting worse. Your pain spreads to your arms, neck, jaw, teeth, or back. You feel nauseous or you vomit. Get help right away if: You have shortness of breath. You vomit blood. You have bright red blood in your stools. You have black, tarry stools. These symptoms may be an emergency. Get help right away. Call 911. Do not wait to see if the symptoms will go away. Do not drive yourself to the hospital. Summary A hiatal hernia occurs when part of the stomach slides above the muscle that separates the abdomen from the chest. A person may be born with a weakness in the hiatus, or a weakness can develop over time. Symptoms of a hiatal hernia may include heartburn, trouble swallowing, or sore throat. Management of a hiatal hernia includes eating frequent small meals instead of three large meals a day. Get help right away if you vomit blood, have bright red blood in your  stools, or have black, tarry stools. This information is not intended to replace advice given to you by your health care provider. Make sure you discuss any questions you have with your health care provider. Document Revised: 02/09/2022 Document Reviewed: 02/09/2022 Elsevier Patient Education  2024 [employer].

## 2023-10-19 NOTE — Progress Notes (Signed)
Patient ID: Joyce Stevenson, female   DOB: [birth year], 57 y.o.   MRN: [id number]  HPI Joyce Stevenson is a 57 y.o. female seen in consultation at the request of Dr Karna Christmas for hiatal hernia She endorses  chronic cough. She was previously seen by ENT and had laryngoscopy. 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 She has implemented lifestyle modification including elevation of head of bed, anti reflux diet and GI evaluation. She has been on PPI with protonix.  She denies any cough with activity and feels cough comes on at rest and at night. Cough does not seem to occur more with eating, she does notice it occurs more when she is sitting or when she is up active. She infrequently feels any volume regurgitation. She tried 40 mg Protonix both in the morning and in the evenings without any significant improvement in cough. She denies any nausea vomiting or dysphagia. She denies any epigastric pain.   She has been taking PPI  She had EGD that I have personally reviewed the images  showing small hiatal hernia.   Prior hx of hysterectomy and salpingectomy   HPI  Past Medical History:  Diagnosis Date   Uterine fibroid     Past Surgical History:  Procedure Laterality Date   BILATERAL SALPINGECTOMY Bilateral 07/15/2021   Procedure: BILATERAL SALPINGECTOMY;  Surgeon: Schermerhorn, Ihor Austin, MD;  Location: [facility];  Service: Gynecology;  Laterality: Bilateral;   COLONOSCOPY  09/2017   COLONOSCOPY WITH ESOPHAGOGASTRODUODENOSCOPY (EGD)  12/2016   VAGINAL HYSTERECTOMY N/A 07/15/2021   Procedure: HYSTERECTOMY VAGINAL;  Surgeon: Suzy Bouchard, MD;  Location: [facility];  Service: Gynecology;  Laterality: N/A;    Family History  Problem Relation Age of Onset   Breast cancer Maternal Aunt     Social History Social History   Tobacco Use   Smoking status: Never    Passive exposure: Never   Smokeless tobacco: Never  Vaping Use   Vaping status: Never Used  Substance  Use Topics   Alcohol use: Not Currently   Drug use: Never    No Known Allergies  Current Outpatient Medications  Medication Sig Dispense Refill   acetaminophen (TYLENOL) 500 MG tablet Take 1,000 mg by mouth every 6 (six) hours as needed for moderate pain.     Multiple Vitamin (MULTIVITAMIN WITH MINERALS) TABS tablet Take 1 tablet by mouth daily.     Phenylephrine-Acetaminophen (TYLENOL SINUS+HEADACHE PO) Take 2 tablets by mouth daily as needed (congestion).     VITAMIN D PO Take 1 capsule by mouth daily.     No current facility-administered medications for this visit.     Review of Systems Full ROS  was asked and was negative except for the information on the HPI  Physical Exam Blood pressure 137/82, pulse 74, temperature 98 F (36.7 C), height 5\' 6"  (1.676 m), weight 200 lb (90.7 kg), last menstrual period 03/02/2017, SpO2 97%. CONSTITUTIONAL: NAD. EYES: Pupils are equal, round, and reactive to light, Sclera are non-icteric. EARS, NOSE, MOUTH AND THROAT: The oropharynx is clear. The oral mucosa is pink and moist. Hearing is intact to voice. LYMPH NODES:  Lymph nodes in the neck are normal. RESPIRATORY:  Lungs are clear. There is normal respiratory effort, with equal breath sounds bilaterally, and without pathologic use of accessory muscles. CARDIOVASCULAR: Heart is regular without murmurs, gallops, or rubs. GI: The abdomen is  soft, nontender, and nondistended. There are no palpable masses. There is no hepatosplenomegaly. There are normal bowel  sounds in all quadrants. GU: Rectal deferred.   MUSCULOSKELETAL: Normal muscle strength and tone. No cyanosis or edema.   SKIN: Turgor is good and there are no pathologic skin lesions or ulcers. NEUROLOGIC: Motor and sensation is grossly normal. Cranial nerves are grossly intact. PSYCH:  Oriented to person, place and time. Affect is normal.  Data Reviewed / I have personally reviewed the patient's imaging, laboratory findings and  medical records.    Assessment/Plan 57 year old female with hiatal hernia.  She does have some atypical reflux symptoms.  It is difficult to determine to what extent her pulmonary symptoms are being caused by reflux.  Talked about hiatal hernias.  Regarding her cough I was very candid with her and was not necessarily certain to which degree correction of reflux and correction of hiatal hernia will help improve her cough.  I do think that we can start with at least further workup for hiatal hernia in the form of a barium swallow as well as a CT scan of the abdomen and pelvis to assess intra-abdominal and mediastinal anatomy. We Talked about robotic paraesophageal hernia repair and what that this will entail.  The risk the benefit and the possible complications. Will see her back in a few weeks after she completes appropriate workup. I spent 55 minutes in this encounter including extensive review of medical records, personally reviewing imaging studies, coordinating her care, placing orders and performing appropriate documentation    Sterling Big, MD FACS General Surgeon 10/19/2023, 2:30 PM

## 2023-10-20 ENCOUNTER — Ambulatory Visit
Admission: RE | Admit: 2023-10-20 | Discharge: 2023-10-20 | Disposition: A | Discharge status: Home / Self Care | Payer: BC Managed Care – PPO | Source: Ambulatory Visit | Attending: Nurse Practitioner | Admitting: Nurse Practitioner

## 2023-10-20 DIAGNOSIS — Z1231 Encounter for screening mammogram for malignant neoplasm of breast: Secondary | ICD-10-CM | POA: Diagnosis present

## 2023-11-02 ENCOUNTER — Other Ambulatory Visit: Payer: BC Managed Care – PPO

## 2023-11-02 ENCOUNTER — Ambulatory Visit: Admission: RE | Admit: 2023-11-02 | Payer: BC Managed Care – PPO | Source: Ambulatory Visit

## 2023-11-11 ENCOUNTER — Telehealth: Payer: Self-pay

## 2023-11-11 NOTE — Telephone Encounter (Signed)
Authorization has been obtained for the patient's CT scan and barium swallow.  The patient is scheduled for this at [facility] on 11/23/23. She will need at [facility] at 8:45 am and have nothing to eat or drink for 3 hours prior.

## 2023-11-14 ENCOUNTER — Ambulatory Visit: Payer: BC Managed Care – PPO | Admitting: Surgery

## 2023-11-23 ENCOUNTER — Ambulatory Visit
Admission: RE | Admit: 2023-11-23 | Discharge: 2023-11-23 | Disposition: A | Discharge status: Home / Self Care | Payer: BC Managed Care – PPO | Source: Ambulatory Visit | Attending: Surgery

## 2023-11-23 ENCOUNTER — Ambulatory Visit
Admission: RE | Admit: 2023-11-23 | Discharge: 2023-11-23 | Disposition: A | Discharge status: Home / Self Care | Payer: BC Managed Care – PPO | Source: Ambulatory Visit | Attending: Surgery | Admitting: Surgery

## 2023-11-23 ENCOUNTER — Telehealth: Payer: Self-pay

## 2023-11-23 DIAGNOSIS — K219 Gastro-esophageal reflux disease without esophagitis: Secondary | ICD-10-CM

## 2023-11-23 DIAGNOSIS — K449 Diaphragmatic hernia without obstruction or gangrene: Secondary | ICD-10-CM | POA: Diagnosis present

## 2023-11-23 MED ORDER — IOHEXOL 300 MG/ML  SOLN
100.0000 mL | Freq: Once | INTRAMUSCULAR | Status: AC | PRN
  Administered 2023-11-23: 100 mL via INTRAVENOUS

## 2023-11-23 NOTE — Telephone Encounter (Signed)
-----   Message from [place] sent at 11/23/2023 11:18 AM EST ----- Pleease let her know swallow shows a hiatal hernia but no other surprises ----- Message ----- From: Interface, Rad Results In Sent: 11/23/2023  11:09 AM EST To: Leafy Ro, MD

## 2023-11-23 NOTE — Telephone Encounter (Signed)
Notified patient as instructed, patient pleased. Discussed follow-up appointments, patient agrees  

## 2023-12-05 ENCOUNTER — Encounter: Payer: Self-pay | Admitting: Surgery

## 2023-12-05 ENCOUNTER — Ambulatory Visit: Payer: BC Managed Care – PPO | Admitting: Surgery

## 2023-12-05 VITALS — BP 136/82 | HR 71 | Temp 98.3°F | Ht 66.0 in | Wt 199.0 lb

## 2023-12-05 DIAGNOSIS — K449 Diaphragmatic hernia without obstruction or gangrene: Secondary | ICD-10-CM | POA: Diagnosis not present

## 2023-12-05 DIAGNOSIS — K219 Gastro-esophageal reflux disease without esophagitis: Secondary | ICD-10-CM | POA: Diagnosis not present

## 2023-12-05 NOTE — Progress Notes (Signed)
Outpatient Surgical Follow Up  12/05/2023  Camrey Cairnes is an 57 y.o. female.   Chief Complaint  Patient presents with   Follow-up    HPI:  Annum Brautigam is a 58 y.o. female seen in f/u for hiatal hernia She endorses  chronic cough, this is her main issue. She was previously seen by ENT and had laryngoscopy. 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 She has implemented lifestyle modification including elevation of head of bed, anti reflux diet and GI evaluation. She has been on PPI with protonix.  She denies any cough with activity and feels cough comes on at rest and at night. Cough does not seem to occur more with eating, she does notice it occurs more when she is sitting or when she is up active. She infrequently feels any volume regurgitation. She tried 40 mg Protonix both in the morning and in the evenings without any significant improvement in cough. She denies any nausea vomiting or dysphagia. She denies any epigastric pain.   She completed her EGD, CT and Barium. I have personally reviewed all images showing sliding hiatal hernia, no strictures    Prior hx of hysterectomy and salpingectomy  Past Medical History:  Diagnosis Date   Uterine fibroid     Past Surgical History:  Procedure Laterality Date   BILATERAL SALPINGECTOMY Bilateral 07/15/2021   Procedure: BILATERAL SALPINGECTOMY;  Surgeon: Schermerhorn, Ihor Austin, MD;  Location: [facility];  Service: Gynecology;  Laterality: Bilateral;   COLONOSCOPY  09/2017   COLONOSCOPY WITH ESOPHAGOGASTRODUODENOSCOPY (EGD)  12/2016   VAGINAL HYSTERECTOMY N/A 07/15/2021   Procedure: HYSTERECTOMY VAGINAL;  Surgeon: Suzy Bouchard, MD;  Location: [facility];  Service: Gynecology;  Laterality: N/A;    Family History  Problem Relation Age of Onset   Breast cancer Maternal Aunt     Social History:  reports that she has never smoked. She has never been exposed to tobacco smoke. She has never used smokeless  tobacco. She reports that she does not currently use alcohol. She reports that she does not use drugs.  Allergies: No Known Allergies  Medications reviewed.    ROS Full ROS performed and is otherwise negative other than what is stated in HPI   Ht 5\' 6"  (1.676 m)   LMP 03/02/2017   BMI 32.28 kg/m   Physical Exam CONSTITUTIONAL: NAD. EYES: Pupils are equal, round, and reactive to light, Sclera are non-icteric. EARS, NOSE, MOUTH AND THROAT: The oropharynx is clear. The oral mucosa is pink and moist. Hearing is intact to voice. LYMPH NODES:  Lymph nodes in the neck are normal. RESPIRATORY:  Lungs are clear. There is normal respiratory effort, with equal breath sounds bilaterally, and without pathologic use of accessory muscles. CARDIOVASCULAR: Heart is regular without murmurs, gallops, or rubs. GI: The abdomen is  soft, nontender, and nondistended. There are no palpable masses. There is no hepatosplenomegaly. There are normal bowel sounds in all quadrants. GU: Rectal deferred.   MUSCULOSKELETAL: Normal muscle strength and tone. No cyanosis or edema.   SKIN: Turgor is good and there are no pathologic skin lesions or ulcers. NEUROLOGIC: Motor and sensation is grossly normal. Cranial nerves are grossly intact. PSYCH:  Oriented to person, place and time. Affect is normal   Assessment/Plan: 57 year-old female with symptomatic hiatal hernia.  She does have some atypical reflux symptoms.  It is difficult to determine to what extent her pulmonary symptoms are being caused by reflux. We Talked about hiatal hernias.  Regarding  her cough I was very candid with her and was not necessarily certain to which degree correction of reflux and correction of hiatal hernia would help improve her cough.   WE have reviewed her images and I do think she would be a good candidate for robotic repair. We Talked about robotic paraesophageal hernia repair and what that this will entail.  The risk the benefit and  the possible complications.She wishes to think about potential nissen fundoplication and H/H repair, I will see her in 1 month  I spent 40 minutes in this encounter including extensive review of medical records, personally reviewing imaging studies, coordinating her care, placing orders and performing appropriate documentation  Sterling Big, MD [facility] General Surgeon

## 2023-12-05 NOTE — Patient Instructions (Addendum)
Follow up here in 1 month.    Laparoscopic Nissen Fundoplication Laparoscopic Nissen fundoplication is surgery to relieve heartburn and other problems caused by gastric fluids flowing up into your esophagus. The esophagus is the tube that carries food and liquid from your throat to your stomach. Normally, the muscle that sits between your stomach and your esophagus (lower esophageal sphincter or LES) keeps stomach fluids in your stomach. In some people, the LES does not work properly, and stomach fluids flow up into the esophagus. This can happen when part of the stomach bulges through the LES (hiatal hernia). The backward flow of stomach fluids can cause a type of severe and long-standing heartburn that is called gastroesophageal reflux disease (GERD). You may need this surgery if other treatments for GERD have not helped. In a laparoscopic Nissen fundoplication, the upper part of your stomach is wrapped around the lower part of your esophagus to strengthen the LES and prevent reflux. If you have a hiatal hernia, it will also be repaired with this surgery. The procedure is done through several small incisions in your abdomen. It is performed using a thin, telescopic instrument (laparoscope) and other instruments that can pass through the scope or through other small incisions. Tell a health care provider about: Any allergies you have. All medicines you are taking, including vitamins, herbs, eye drops, creams, and over-the-counter medicines. Any problems you or family members have had with anesthetic medicines. Any blood disorders you have. Any surgeries you have had. Any medical conditions you have. What are the risks? Generally, this is a safe procedure. However, problems may occur, including: Difficulty swallowing (dysphagia). Bloating. Nausea or vomiting. Damage to the lung, causing a collapsed lung. Infection or bleeding. What happens before the procedure? Ask your health care provider  about: Changing or stopping your regular medicines. This is especially important if you are taking diabetes medicines or blood thinners. Taking medicines such as aspirin and ibuprofen. These medicines can thin your blood. Do not take these medicines before your procedure if your health care provider asks you not to. Follow your health care provider's instructions about eating or drinking restrictions. Plan to have someone take you home after the procedure. What happens during the procedure? An IV tube will be inserted into one of your veins. It will be used to give you fluids and medicines during the procedure. You will be given a medicine that makes you fall asleep (general anesthetic). Your abdomen will be cleaned with a germ-killing solution (antiseptic). The surgeon will make a small incision in your abdomen and insert a tube through the incision. Your abdomen will be filled with a gas. This helps the surgeon to see your organs more easily and it makes more space to work. The surgeon will insert the laparoscope through the incision. The scope has a camera that will send pictures to a monitor in the operating room. The surgeon will make several other small incisions in your abdomen to insert the other instruments that are needed during the procedure. Another instrument (dilator) will be passed through your mouth and down your esophagus into the upper part of your stomach. The dilator will prevent your LES from being closed too tightly during surgery. The surgeon will pass the top portion of your stomach behind the lower part of your esophagus and wrap it all the way around. This will be stitched into place. If you have a hiatal hernia, it will be repaired during this procedure. All instruments will be removed, and your  incisions will be closed under your skin with stitches (sutures). Skin adhesive strips may also be used. A bandage (dressing) will be placed on your skin over the incisions. The  procedure may vary among health care providers and hospitals. What happens after the procedure? You will be moved to a recovery area. Your blood pressure, heart rate, breathing rate, and blood oxygen level will be monitored often until the medicines you were given have worn off. You will be given pain medicine as needed. Your IV tube will be kept in until you are able to drink fluids. This information is not intended to replace advice given to you by your health care provider. Make sure you discuss any questions you have with your health care provider. Document Released: 01/03/2015 Document Revised: 05/20/2016 Document Reviewed: 08/14/2014 Elsevier Interactive Patient Education  2017 [employer]   Laparoscopic Nissen Fundoplication, Care After Refer to this sheet in the next few weeks. These instructions provide you with information about caring for yourself after your procedure. Your health care provider may also give you more specific instructions. Your treatment has been planned according to current medical practices, but problems sometimes occur. Call your health care provider if you have any problems or questions after your procedure. What can I expect after the procedure? After the procedure, it is common to have: Difficulty swallowing (dysphagia). Excess gas (bloating). Follow these instructions at home: Medicines  Take medicines only as directed by your health care provider. Do not drive or operate heavy machinery while taking pain medicine. Incision care  There are many different ways to close and cover an incision, including stitches (sutures), skin glue, and adhesive strips. Follow your health care provider's instructions about: Incision care. Bandage (dressing) changes and removal. Incision closure removal. Check your incision areas every day for signs of infection. Watch for: Redness, swelling, or pain. Fluid, blood, or pus. Do not take baths, swim, or use a hot tub until  your health care provider approves. Take showers as directed by your health care provider. Eating and drinking  Follow your health care provider's instructions about eating. You may need to follow a very soft diet for 2 weeks, followed by a diet of more regular foods for 2 weeks, no breads. You should return to your usual diet gradually. Drink enough fluid to keep your urine clear or pale yellow. Activity  Return to your normal activities as directed by your health care provider. Ask your health care provider what activities are safe for you. Avoid strenuous exercise. Do not lift anything that is heavier than 10 lb (4.5 kg). Ask your health care provider when you can: Return to sexual activity. Drive. Go back to work. Contact a health care provider if: You have a fever. Your pain gets worse or is not helped by medicine. You have frequent nausea or vomiting. You have continued abdominal bloating. You have an ongoing (persistent) cough. You have redness, swelling, or pain in any incision areas. You have fluid, blood, or pus coming from any incisions. Get help right away if: You have trouble breathing. You are unable to swallow. You have persistent vomiting. You have blood in your vomit. You have severe abdominal pain. This information is not intended to replace advice given to you by your health care provider. Make sure you discuss any questions you have with your health care provider. Document Released: 08/05/2004 Document Revised: 05/20/2016 Document Reviewed: 08/14/2014 Elsevier Interactive Patient Education  2017 [employer] Inc.   Diet After Nissen Fundoplication Surgery  This diet information is for patients who have recently had Nissen fundoplication surgery to correct reflux disease or to repair various types of hernias, such as hiatal hernia and intrathoracic stomach. This diet may also be used for other gastrointestinal surgeries, such as Heller myotomy and repair of achalasia.  The diet will help control diarrhea, excess gas and swallowing problems, which may occur after this type of surgery. Keeping Your Stomach from Stretching Eat small, frequent meals (six to eight per day). This will help you consume the majority of the nutrients you need without causing your stomach to feel full or distended.  Drinking large amounts of fluids with meals can stretch your stomach. You may drink fluids between meals as often as you like, but limit fluids to 1/2 cup (4 fluid ounces) with meals and one cup (8 fluid ounces) with snacks.  Sit upright while eating and stay upright for 30 minutes after each meal. Gravity can help food move through your digestive tract. Do not lie down after eating. Sit upright for 2 hours after your last meal or snack of the day.  Eat very slowly. Take your time when eating.  Take small bites and chew your food well to help aid in swallowing and digestion.  Avoid crusty breads and sticky, gummy foods, such as bananas, fresh doughy breads, rolls and doughnuts. These types of foods become sticky and difficult to swallow.  Toasted breads tend to be better tolerated.  Lastly, if you eat sweets, consume them at the end of your meal to avoid a group of symptoms referred to as "dumping syndrome". This describes the rapid emptying of foods from the stomach to the small intestine. Sweetened beverages, candy and desserts move more rapidly and dump quickly into the intestines. This can cause symptoms of nausea, weakness, cold sweats, cramps, diarrhea and dizzy spells.  Avoiding Gas Avoid drinking through a straw. Do not chew gum or tobacco. These actions cause you to swallow air, which produces excess gas in your stomach. Chew with your mouth closed.  Avoid any foods that cause stomach gas and distention. These foods include corn, dried beans, peas, lentils, onions, broccoli, cauliflower and any food from the cabbage family.  Avoid carbonated drinks, alcohol, citrus and  tomato products.  When will I be able to eat a soft diet? After Nissen fundoplication surgery, your diet will be advanced slowly by your surgeon. Generally, you will be on a clear liquid diet for the first few meals. Then you will advance to the full liquid diet for a meal or two and eventually to a Nissen soft diet. Please be aware that each patient's tolerance to food is different. Your doctor will advance your diet depending on how well you progress after surgery. Clear Liquid Diet  The first diet after surgery is the clear liquid diet. It includes the following liquids: Apple juice  Cranberry juice  Grape juice  Chicken broth  Beef broth  Flavored gelatin (Jell-O)  Decaf tea and coffee  Caffeinated beverages are permitted based on tolerance  Popsicles  [place] ice Carbonated drinks (sodas) are not allowed for the first six to eight weeks after surgery. After this time you can try them again in small amounts.  Full Liquid Diet The full liquid diet contains anything on the clear liquid diet, plus: Milk, soy, rice and almond (no chocolate)  Cream of wheat, cream of rice, grits  Strained creamed soups (no tomato or broccoli)  Vanilla and strawberry-flavored ice cream  Sherbet  Blended, custard styled or whipped yogurt (plain or vanilla only)  Vanilla and butterscotch pudding (no chocolate or coconut)  Nutritional drinks including Ensure, Boost, Carnation Instant Breakfast (no chocolate-flavored) Note: Dairy products, such as milk, ice cream and pudding, may cause diarrhea in some people just after surgery. You may need to avoid milk products. If so, substitute them with lactose-free beverages, such as soy, rice, Lactaid or almond milks.  Nissen Soft Diet Food Category Foods to Choose Foods to Avoid  Beverages Milk, such as, whole, 2%, 1%, non-fat, or skim, soy, rice, almond  Caffeinated and decaf tea and coffee  Powdered drink mixes (in moderation)  Non-citrus juices (apple,  grape, cranberry or blends of these)  Fruit nectars  Nutritional drinks including Boost, Ensure, Carnation Instant Breakfast Chocolate milk, cocoa or other chocolate-flavored drinks  Carbonated drinks  Alcohol  Citrus juices like orange, grapefruit, lemon and lime  Breads Crackers (saltine, butter, soda, graham, Goldfish and Cheese Nips)   Untoasted bread, bagels, [place] and hard rolls, English muffins  Crackers with nuts, seeds, fresh or dried fruit, coconut, or highly seasoned, such as garlic or onion-flavored  Sweet rolls, coffee cake or doughnuts  Cereals Well cooked cereals, such as oatmeal (plain or flavored)  Cold cereal (Cornflakes, Rice Krispies, Cheerios, Special K plain, Rice Chex and puffed rice) Very coarse cereal, such as bran, shredded wheat  Any cereal with fresh or dried fruit, coconut, seeds or nuts  Desserts Eat in moderation and do not eat desserts or sweets by themselves. Plain cakes, cookies and cream-filled pies  Vanilla and butterscotch pudding or custard  Ice cream, ice milk, frozen yogurt and sherbet  Gelatin made from allowed foods  Fruit ices and popsicles Desserts containing chocolate, coconut, nuts, seeds, fresh or dried fruit, peppermint or spearmint  Eggs  Poached, hard boiled or scrambled Fried eggs and highly seasoned eggs (deviled eggs)  Fats Eat in moderation. Butter and margarine  Mayonnaise and vegetable oils  Mildly seasoned cream sauces and gravies  Plain cream cheese  Sour cream Highly seasoned salad dressings, cream sauces and gravies  Bacon, bacon fat, ham fat, lard and salt pork  Fried foods  Nuts  Fruits Fruit juice  Any canned or cooked fruit except those listed in the AVOID column ALL fresh fruits, such as citrus, apples, and pineapple  Canned pineapple  Dried fruits, such as raisins, berries  Fruits with seeds, such as berries, kiwi and figs  Meat, Fish, Poultry, and [employer] may be ground, minced or chopped to  ease swallowing and digestion  Tender, well cooked and moist cuts of beef, chicken, [place] and pork  Veal and lamb  Flaky, cooked fish  Canned tuna  Cottage and ricotta cheeses  Mild cheese, such as American, brick, mozzarella and baby Swiss  Creamy peanut butter  Plain custard or blended fruit yogurt  Moist casseroles, such as macaroni & cheese, tuna noodle  Grilled or toasted cheese sandwich Tough meats with a lot of gristle  Fried, highly seasoned, smoked and fatty meat, fish or poultry, such as frankfurters, luncheon meats, sausage, bacon, spare ribs, beef brisket, sardines, anchovies, duck and goose  Chili and other entrees made with pepper or chili pepper  Shellfish  Strongly flavored cheeses, such as sharp cheese, extra sharp cheddar, cheese containing peppers or other seasonings  Crunchy peanut butter  Any yogurt with nuts, seeds, coconut, strawberries or raspberries  Potatoes and Starches Peeled, mashed or boiled white or sweet potatoes  Oven-baked potatoes without  skin  Well cooked white rice, enriched noodles, barley, spaghetti, macaroni and other pastas Fried potatoes, potato skins and potato chips  Hard and soft taco shells  Fried, brown or wild rice  Soups Mildly flavored meat stocks  Cream soups made from allowed foods Highly seasoned soups and tomato based soups, cream soups made with gas producing vegetables, such as broccoli, cauliflower, onion, etc.  Sweets and Snacks Use in moderation and do not eat large amounts of sweets by themselves. Syrup, honey, jelly and seedless jam  Plain hard candies and plain candies made with allowed ingredients  Molasses  Marshmallows  Other candy made from allowed ingredients  Thin pretzels Jam, marmalade and preserves  Chocolate in any form  Any candy containing nuts, coconut, seeds, peppermint, spearmint or dried or fresh fruit  Popcorn, potato chips, tortilla chips  Soft or hard thick pretzels, such as sourdough  Vegetables Well  cooked soft vegetables without seeds or skins, such as asparagus tips, beets, carrots, green and wax beans, chopped spinach, tender canned baby peas, squash and pumpkin Raw vegetables, tomatoes, tomato juice, tomato sauce and V-8 juice(tomato based products can irritate the stomach)  Gas producing vegetables, such as broccoli, Brussel sprouts, cabbage, cauliflower, onions, corn, cucumber, green peppers, rutabagas, turnips, radishes and sauerkraut  Dried beans, peas and lentils  Miscellaneous Salt and spices in moderation  Mustard and vinegar in moderation Fried or highly seasoned foods  Coconut and seeds  Pickles and olives  Chili sauces, ketchup, barbecue sauce, horseradish, black pepper, chili powder and onion and garlic seasonings  Any other strongly flavored seasoning, condiment, spice or herb not tolerated  Any food not tolerated

## 2024-01-11 ENCOUNTER — Telehealth: Payer: Self-pay | Admitting: Surgery

## 2024-01-11 ENCOUNTER — Ambulatory Visit (INDEPENDENT_AMBULATORY_CARE_PROVIDER_SITE_OTHER): Payer: BC Managed Care – PPO | Admitting: Surgery

## 2024-01-11 ENCOUNTER — Encounter: Payer: Self-pay | Admitting: Surgery

## 2024-01-11 VITALS — BP 140/80 | HR 76 | Temp 98.0°F | Ht 66.0 in | Wt 202.0 lb

## 2024-01-11 DIAGNOSIS — K449 Diaphragmatic hernia without obstruction or gangrene: Secondary | ICD-10-CM | POA: Diagnosis not present

## 2024-01-11 DIAGNOSIS — K219 Gastro-esophageal reflux disease without esophagitis: Secondary | ICD-10-CM | POA: Diagnosis not present

## 2024-01-11 NOTE — H&P (View-Only) (Signed)
 Surgical Consultation  01/11/2024  Joyce Stevenson is an 58 y.o. female.   Chief Complaint  Patient presents with   Follow-up     HPI:  Joyce Stevenson is a 58 y.o. female seen in f/u for hiatal hernia She endorses  chronic cough, this is her main issue. She was previously seen by ENT and had laryngoscopy. 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 She has implemented lifestyle modification including elevation of head of bed, anti reflux diet and GI evaluation. She has been on PPI with protonix.  She denies any cough with activity and feels cough comes on at rest and at night. Cough does not seem to occur more with eating, she does notice it occurs more when she is sitting or when she is up active. She infrequently feels any volume regurgitation. She tried 40 mg Protonix both in the morning and in the evenings without any significant improvement in cough. She denies any nausea vomiting or dysphagia. She denies any epigastric pain.   She completed her EGD, CT and Barium. I have personally reviewed all images showing sliding hiatal hernia, no strictures  She is ready to move forward with Nissen + HH repair   Prior hx of hysterectomy and salpingectomy    Past Medical History:  Diagnosis Date   Uterine fibroid     Past Surgical History:  Procedure Laterality Date   BILATERAL SALPINGECTOMY Bilateral 07/15/2021   Procedure: BILATERAL SALPINGECTOMY;  Surgeon: Schermerhorn, Joselyn Nicely, MD;  Location: [facility];  Service: Gynecology;  Laterality: Bilateral;   COLONOSCOPY  09/2017   COLONOSCOPY WITH ESOPHAGOGASTRODUODENOSCOPY (EGD)  12/2016   VAGINAL HYSTERECTOMY N/A 07/15/2021   Procedure: HYSTERECTOMY VAGINAL;  Surgeon: Carolynn Citrin, MD;  Location: [facility];  Service: Gynecology;  Laterality: N/A;    Family History  Problem Relation Age of Onset   Breast cancer Maternal Aunt     Social History:  reports that she has never smoked. She has never been  exposed to tobacco smoke. She has never used smokeless tobacco. She reports that she does not currently use alcohol. She reports that she does not use drugs.  Allergies: No Known Allergies  Medications reviewed.     ROS Full ROS performed and is otherwise negative other than what is stated in the HPI    BP (!) 140/80   Pulse 76   Temp 98 F (36.7 C)   Ht 5\' 6"  (1.676 m)   Wt 202 lb (91.6 kg)   LMP 03/02/2017   SpO2 97%   BMI 32.60 kg/m   Physical Exam  Physical Exam CONSTITUTIONAL: NAD. EYES: Pupils are equal, round, and reactive to light, Sclera are non-icteric. EARS, NOSE, MOUTH AND THROAT: The oropharynx is clear. The oral mucosa is pink and moist. Hearing is intact to voice. LYMPH NODES:  Lymph nodes in the neck are normal. RESPIRATORY:  Lungs are clear. There is normal respiratory effort, with equal breath sounds bilaterally, and without pathologic use of accessory muscles. CARDIOVASCULAR: Heart is regular without murmurs, gallops, or rubs. GI: The abdomen is  soft, nontender, and nondistended. There are no palpable masses. There is no hepatosplenomegaly. There are normal bowel sounds in all quadrants. GU: Rectal deferred.   MUSCULOSKELETAL: Normal muscle strength and tone. No cyanosis or edema.   SKIN: Turgor is good and there are no pathologic skin lesions or ulcers. NEUROLOGIC: Motor and sensation is grossly normal. Cranial nerves are grossly intact. PSYCH:  Oriented to person, place and time.  Affect is normal  Assessment/Plan:  58 year-old female with symptomatic hiatal hernia.  She does have some atypical reflux symptoms.  It is difficult to determine to what extent her pulmonary symptoms are being caused by reflux. We Talked about hiatal hernias.  Regarding her cough I was very candid with her and was not necessarily certain to which degree correction of reflux and correction of hiatal hernia would help improve her cough.   WE have reviewed her images and I do  think she would be a good candidate for robotic repair of HH + Niseen. We Talked about robotic paraesophageal hernia repair and what that this will entail.  The risk the benefit and the possible complications.is ready and she understands the situation in detail.   I answered all her questions for her today  I spent 40 minutes in this encounter including extensive review of medical records, personally reviewing imaging studies, coordinating her care, placing orders and performing appropriate documentation    Evelia Hipp, MD FACS General Surgeon dermatitis

## 2024-01-11 NOTE — Telephone Encounter (Signed)
 Patient has been advised of Pre-Admission date/time, and Surgery date at [facility].  Surgery Date: 02/02/24 Preadmission Testing Date: 01/25/24 (phone 8a-1p)  Patient has been made aware to call [phone], between 1-3:00pm the day before surgery, to find out what time to arrive for surgery.

## 2024-01-11 NOTE — Progress Notes (Signed)
 Surgical Consultation  01/11/2024  Joyce Stevenson is an 57 y.o. female.   Chief Complaint  Patient presents with   Follow-up     HPI:  Joyce Stevenson is a 58 y.o. female seen in f/u for hiatal hernia She endorses  chronic cough, this is her main issue. She was previously seen by ENT and had laryngoscopy. 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 She has implemented lifestyle modification including elevation of head of bed, anti reflux diet and GI evaluation. She has been on PPI with protonix.  She denies any cough with activity and feels cough comes on at rest and at night. Cough does not seem to occur more with eating, she does notice it occurs more when she is sitting or when she is up active. She infrequently feels any volume regurgitation. She tried 40 mg Protonix both in the morning and in the evenings without any significant improvement in cough. She denies any nausea vomiting or dysphagia. She denies any epigastric pain.   She completed her EGD, CT and Barium. I have personally reviewed all images showing sliding hiatal hernia, no strictures  She is ready to move forward with Nissen + HH repair   Prior hx of hysterectomy and salpingectomy    Past Medical History:  Diagnosis Date   Uterine fibroid     Past Surgical History:  Procedure Laterality Date   BILATERAL SALPINGECTOMY Bilateral 07/15/2021   Procedure: BILATERAL SALPINGECTOMY;  Surgeon: Schermerhorn, Joselyn Nicely, MD;  Location: [facility];  Service: Gynecology;  Laterality: Bilateral;   COLONOSCOPY  09/2017   COLONOSCOPY WITH ESOPHAGOGASTRODUODENOSCOPY (EGD)  12/2016   VAGINAL HYSTERECTOMY N/A 07/15/2021   Procedure: HYSTERECTOMY VAGINAL;  Surgeon: Carolynn Citrin, MD;  Location: [facility];  Service: Gynecology;  Laterality: N/A;    Family History  Problem Relation Age of Onset   Breast cancer Maternal Aunt     Social History:  reports that she has never smoked. She has never been  exposed to tobacco smoke. She has never used smokeless tobacco. She reports that she does not currently use alcohol. She reports that she does not use drugs.  Allergies: No Known Allergies  Medications reviewed.     ROS Full ROS performed and is otherwise negative other than what is stated in the HPI    BP (!) 140/80   Pulse 76   Temp 98 F (36.7 C)   Ht 5\' 6"  (1.676 m)   Wt 202 lb (91.6 kg)   LMP 03/02/2017   SpO2 97%   BMI 32.60 kg/m   Physical Exam  Physical Exam CONSTITUTIONAL: NAD. EYES: Pupils are equal, round, and reactive to light, Sclera are non-icteric. EARS, NOSE, MOUTH AND THROAT: The oropharynx is clear. The oral mucosa is pink and moist. Hearing is intact to voice. LYMPH NODES:  Lymph nodes in the neck are normal. RESPIRATORY:  Lungs are clear. There is normal respiratory effort, with equal breath sounds bilaterally, and without pathologic use of accessory muscles. CARDIOVASCULAR: Heart is regular without murmurs, gallops, or rubs. GI: The abdomen is  soft, nontender, and nondistended. There are no palpable masses. There is no hepatosplenomegaly. There are normal bowel sounds in all quadrants. GU: Rectal deferred.   MUSCULOSKELETAL: Normal muscle strength and tone. No cyanosis or edema.   SKIN: Turgor is good and there are no pathologic skin lesions or ulcers. NEUROLOGIC: Motor and sensation is grossly normal. Cranial nerves are grossly intact. PSYCH:  Oriented to person, place and time.  Affect is normal  Assessment/Plan:  58 year-old female with symptomatic hiatal hernia.  She does have some atypical reflux symptoms.  It is difficult to determine to what extent her pulmonary symptoms are being caused by reflux. We Talked about hiatal hernias.  Regarding her cough I was very candid with her and was not necessarily certain to which degree correction of reflux and correction of hiatal hernia would help improve her cough.   WE have reviewed her images and I do  think she would be a good candidate for robotic repair of HH + Niseen. We Talked about robotic paraesophageal hernia repair and what that this will entail.  The risk the benefit and the possible complications.is ready and she understands the situation in detail.   I answered all her questions for her today  I spent 40 minutes in this encounter including extensive review of medical records, personally reviewing imaging studies, coordinating her care, placing orders and performing appropriate documentation    Evelia Hipp, MD FACS General Surgeon dermatitis

## 2024-01-11 NOTE — Patient Instructions (Signed)
 We have spoken today about repairing your Hiatal Hernia. Your surgery will be scheduled at [facility] with Dr. Everlene Farrier.  Plan to be in the hospital for 1-2 days if the minimally invasive surgery is completed without having to make a bigger incision. If the bigger incision is made, you will most likely need to be in the hospital 4-6 days. You will be on a soft diet and need to recover for 2 weeks following your surgery prior to doing any of your normal activities. At the 2 week mark, we will see you in the office and if you are doing ok we will advance your diet and activity level as you tolerate.  Please see your Blue (Pre-care) Sheet for more information regarding your surgery. Our surgery scheduler will call you to verify surgery date and to go over information  You will need to arrange to be out of work for approximately 1-2 weeks and then you may return with a lifting restriction for 4 more weeks. If you have FMLA or Disability paperwork that needs to be filled out, please have your company fax your paperwork to [phone] or you may drop this by either office. This paperwork will be filled out within 3 days after your surgery has been completed.  Please call our office with any questions or concerns that you have regarding your surgery and recovery.    Laparoscopic Nissen Fundoplication Laparoscopic Nissen fundoplication is surgery to relieve heartburn and other problems caused by gastric fluids flowing up into your esophagus. The esophagus is the tube that carries food and liquid from your throat to your stomach. Normally, the muscle that sits between your stomach and your esophagus (lower esophageal sphincter or LES) keeps stomach fluids in your stomach. In some people, the LES does not work properly, and stomach fluids flow up into the esophagus. This can happen when part of the stomach bulges through the LES (hiatal hernia). The backward flow of stomach fluids can cause a type of severe and  long-standing heartburn that is called gastroesophageal reflux disease (GERD). You may need this surgery if other treatments for GERD have not helped. In a laparoscopic Nissen fundoplication, the upper part of your stomach is wrapped around the lower part of your esophagus to strengthen the LES and prevent reflux. If you have a hiatal hernia, it will also be repaired with this surgery. The procedure is done through several small incisions in your abdomen. It is performed using a thin, telescopic instrument (laparoscope) and other instruments that can pass through the scope or through other small incisions. Tell a health care provider about: Any allergies you have. All medicines you are taking, including vitamins, herbs, eye drops, creams, and over-the-counter medicines. Any problems you or family members have had with anesthetic medicines. Any blood disorders you have. Any surgeries you have had. Any medical conditions you have. What are the risks? Generally, this is a safe procedure. However, problems may occur, including: Difficulty swallowing (dysphagia). Bloating. Nausea or vomiting. Damage to the lung, causing a collapsed lung. Infection or bleeding. What happens before the procedure? Ask your health care provider about: Changing or stopping your regular medicines. This is especially important if you are taking diabetes medicines or blood thinners. Taking medicines such as aspirin and ibuprofen. These medicines can thin your blood. Do not take these medicines before your procedure if your health care provider asks you not to. Follow your health care provider's instructions about eating or drinking restrictions. Plan to have someone take  you home after the procedure. What happens during the procedure? An IV tube will be inserted into one of your veins. It will be used to give you fluids and medicines during the procedure. You will be given a medicine that makes you fall asleep (general  anesthetic). Your abdomen will be cleaned with a germ-killing solution (antiseptic). The surgeon will make a small incision in your abdomen and insert a tube through the incision. Your abdomen will be filled with a gas. This helps the surgeon to see your organs more easily and it makes more space to work. The surgeon will insert the laparoscope through the incision. The scope has a camera that will send pictures to a monitor in the operating room. The surgeon will make several other small incisions in your abdomen to insert the other instruments that are needed during the procedure. Another instrument (dilator) will be passed through your mouth and down your esophagus into the upper part of your stomach. The dilator will prevent your LES from being closed too tightly during surgery. The surgeon will pass the top portion of your stomach behind the lower part of your esophagus and wrap it all the way around. This will be stitched into place. If you have a hiatal hernia, it will be repaired during this procedure. All instruments will be removed, and your incisions will be closed under your skin with stitches (sutures). Skin adhesive strips may also be used. A bandage (dressing) will be placed on your skin over the incisions. The procedure may vary among health care providers and hospitals. What happens after the procedure? You will be moved to a recovery area. Your blood pressure, heart rate, breathing rate, and blood oxygen level will be monitored often until the medicines you were given have worn off. You will be given pain medicine as needed. Your IV tube will be kept in until you are able to drink fluids. This information is not intended to replace advice given to you by your health care provider. Make sure you discuss any questions you have with your health care provider. Document Released: 01/03/2015 Document Revised: 05/20/2016 Document Reviewed: 08/14/2014 Elsevier Interactive Patient  Education  2017 [employer]   Laparoscopic Nissen Fundoplication, Care After Refer to this sheet in the next few weeks. These instructions provide you with information about caring for yourself after your procedure. Your health care provider may also give you more specific instructions. Your treatment has been planned according to current medical practices, but problems sometimes occur. Call your health care provider if you have any problems or questions after your procedure. What can I expect after the procedure? After the procedure, it is common to have: Difficulty swallowing (dysphagia). Excess gas (bloating). Follow these instructions at home: Medicines  Take medicines only as directed by your health care provider. Do not drive or operate heavy machinery while taking pain medicine. Incision care  There are many different ways to close and cover an incision, including stitches (sutures), skin glue, and adhesive strips. Follow your health care provider's instructions about: Incision care. Bandage (dressing) changes and removal. Incision closure removal. Check your incision areas every day for signs of infection. Watch for: Redness, swelling, or pain. Fluid, blood, or pus. Do not take baths, swim, or use a hot tub until your health care provider approves. Take showers as directed by your health care provider. Eating and drinking  Follow your health care provider's instructions about eating. You may need to follow a very soft diet for 2  weeks, followed by a diet of more regular foods for 2 weeks, no breads. You should return to your usual diet gradually. Drink enough fluid to keep your urine clear or pale yellow. Activity  Return to your normal activities as directed by your health care provider. Ask your health care provider what activities are safe for you. Avoid strenuous exercise. Do not lift anything that is heavier than 10 lb (4.5 kg). Ask your health care provider when you  can: Return to sexual activity. Drive. Go back to work. Contact a health care provider if: You have a fever. Your pain gets worse or is not helped by medicine. You have frequent nausea or vomiting. You have continued abdominal bloating. You have an ongoing (persistent) cough. You have redness, swelling, or pain in any incision areas. You have fluid, blood, or pus coming from any incisions. Get help right away if: You have trouble breathing. You are unable to swallow. You have persistent vomiting. You have blood in your vomit. You have severe abdominal pain. This information is not intended to replace advice given to you by your health care provider. Make sure you discuss any questions you have with your health care provider. Document Released: 08/05/2004 Document Revised: 05/20/2016 Document Reviewed: 08/14/2014 Elsevier Interactive Patient Education  2017 Elsevier Inc.   Diet After Nissen Fundoplication Surgery This diet information is for patients who have recently had Nissen fundoplication surgery to correct reflux disease or to repair various types of hernias, such as hiatal hernia and intrathoracic stomach. This diet may also be used for other gastrointestinal surgeries, such as Heller myotomy and repair of achalasia. The diet will help control diarrhea, excess gas and swallowing problems, which may occur after this type of surgery. Keeping Your Stomach from Stretching Eat small, frequent meals (six to eight per day). This will help you consume the majority of the nutrients you need without causing your stomach to feel full or distended.  Drinking large amounts of fluids with meals can stretch your stomach. You may drink fluids between meals as often as you like, but limit fluids to 1/2 cup (4 fluid ounces) with meals and one cup (8 fluid ounces) with snacks.  Sit upright while eating and stay upright for 30 minutes after each meal. Gravity can help food move through your digestive  tract. Do not lie down after eating. Sit upright for 2 hours after your last meal or snack of the day.  Eat very slowly. Take your time when eating.  Take small bites and chew your food well to help aid in swallowing and digestion.  Avoid crusty breads and sticky, gummy foods, such as bananas, fresh doughy breads, rolls and doughnuts. These types of foods become sticky and difficult to swallow.  Toasted breads tend to be better tolerated.  Lastly, if you eat sweets, consume them at the end of your meal to avoid a group of symptoms referred to as "dumping syndrome". This describes the rapid emptying of foods from the stomach to the small intestine. Sweetened beverages, candy and desserts move more rapidly and dump quickly into the intestines. This can cause symptoms of nausea, weakness, cold sweats, cramps, diarrhea and dizzy spells.  Avoiding Gas Avoid drinking through a straw. Do not chew gum or tobacco. These actions cause you to swallow air, which produces excess gas in your stomach. Chew with your mouth closed.  Avoid any foods that cause stomach gas and distention. These foods include corn, dried beans, peas, lentils, onions, broccoli, cauliflower  and any food from the cabbage family.  Avoid carbonated drinks, alcohol, citrus and tomato products.  When will I be able to eat a soft diet? After Nissen fundoplication surgery, your diet will be advanced slowly by your surgeon. Generally, you will be on a clear liquid diet for the first few meals. Then you will advance to the full liquid diet for a meal or two and eventually to a Nissen soft diet. Please be aware that each patient's tolerance to food is different. Your doctor will advance your diet depending on how well you progress after surgery. Clear Liquid Diet  The first diet after surgery is the clear liquid diet. It includes the following liquids: Apple juice  Cranberry juice  Grape juice  Chicken broth  Beef broth  Flavored gelatin  (Jell-O)  Decaf tea and coffee  Caffeinated beverages are permitted based on tolerance  Popsicles  [place] ice Carbonated drinks (sodas) are not allowed for the first six to eight weeks after surgery. After this time you can try them again in small amounts.  Full Liquid Diet The full liquid diet contains anything on the clear liquid diet, plus: Milk, soy, rice and almond (no chocolate)  Cream of wheat, cream of rice, grits  Strained creamed soups (no tomato or broccoli)  Vanilla and strawberry-flavored ice cream  Sherbet  Blended, custard styled or whipped yogurt (plain or vanilla only)  Vanilla and butterscotch pudding (no chocolate or coconut)  Nutritional drinks including Ensure, Boost, Carnation Instant Breakfast (no chocolate-flavored) Note: Dairy products, such as milk, ice cream and pudding, may cause diarrhea in some people just after surgery. You may need to avoid milk products. If so, substitute them with lactose-free beverages, such as soy, rice, Lactaid or almond milks.  Nissen Soft Diet Food Category Foods to Choose Foods to Avoid  Beverages Milk, such as, whole, 2%, 1%, non-fat, or skim, soy, rice, almond  Caffeinated and decaf tea and coffee  Powdered drink mixes (in moderation)  Non-citrus juices (apple, grape, cranberry or blends of these)  Fruit nectars  Nutritional drinks including Boost, Ensure, Carnation Instant Breakfast Chocolate milk, cocoa or other chocolate-flavored drinks  Carbonated drinks  Alcohol  Citrus juices like orange, grapefruit, lemon and lime  Breads Crackers (saltine, butter, soda, graham, Goldfish and Cheese Nips)   Untoasted bread, bagels, [place] and hard rolls, English muffins  Crackers with nuts, seeds, fresh or dried fruit, coconut, or highly seasoned, such as garlic or onion-flavored  Sweet rolls, coffee cake or doughnuts  Cereals Well cooked cereals, such as oatmeal (plain or flavored)  Cold cereal (Cornflakes, Rice  Krispies, Cheerios, Special K plain, Rice Chex and puffed rice) Very coarse cereal, such as bran, shredded wheat  Any cereal with fresh or dried fruit, coconut, seeds or nuts  Desserts Eat in moderation and do not eat desserts or sweets by themselves. Plain cakes, cookies and cream-filled pies  Vanilla and butterscotch pudding or custard  Ice cream, ice milk, frozen yogurt and sherbet  Gelatin made from allowed foods  Fruit ices and popsicles Desserts containing chocolate, coconut, nuts, seeds, fresh or dried fruit, peppermint or spearmint  Eggs  Poached, hard boiled or scrambled Fried eggs and highly seasoned eggs (deviled eggs)  Fats Eat in moderation. Butter and margarine  Mayonnaise and vegetable oils  Mildly seasoned cream sauces and gravies  Plain cream cheese  Sour cream Highly seasoned salad dressings, cream sauces and gravies  Bacon, bacon fat, ham fat, lard and salt pork  Foy [place]  foods  Nuts  Fruits Fruit juice  Any canned or cooked fruit except those listed in the AVOID column ALL fresh fruits, such as citrus, apples, and pineapple  Canned pineapple  Dried fruits, such as raisins, berries  Fruits with seeds, such as berries, kiwi and figs  Meat, Fish, Poultry, and [employer] may be ground, minced or chopped to ease swallowing and digestion  Tender, well cooked and moist cuts of beef, chicken, [place] and pork  Veal and lamb  Flaky, cooked fish  Canned tuna  Cottage and ricotta cheeses  Mild cheese, such as American, brick, mozzarella and baby Swiss  Creamy peanut butter  Plain custard or blended fruit yogurt  Moist casseroles, such as macaroni & cheese, tuna noodle  Grilled or toasted cheese sandwich Tough meats with a lot of gristle  Fried, highly seasoned, smoked and fatty meat, fish or poultry, such as frankfurters, luncheon meats, sausage, bacon, spare ribs, beef brisket, sardines, anchovies, duck and goose  Chili and other entrees made with pepper or  chili pepper  Shellfish  Strongly flavored cheeses, such as sharp cheese, extra sharp cheddar, cheese containing peppers or other seasonings  Crunchy peanut butter  Any yogurt with nuts, seeds, coconut, strawberries or raspberries  Potatoes and Starches Peeled, mashed or boiled white or sweet potatoes  Oven-baked potatoes without skin  Well cooked white rice, enriched noodles, barley, spaghetti, macaroni and other pastas Fried potatoes, potato skins and potato chips  Hard and soft taco shells  Fried, brown or wild rice  Soups Mildly flavored meat stocks  Cream soups made from allowed foods Highly seasoned soups and tomato based soups, cream soups made with gas producing vegetables, such as broccoli, cauliflower, onion, etc.  Sweets and Snacks Use in moderation and do not eat large amounts of sweets by themselves. Syrup, honey, jelly and seedless jam  Plain hard candies and plain candies made with allowed ingredients  Molasses  Marshmallows  Other candy made from allowed ingredients  Thin pretzels Jam, marmalade and preserves  Chocolate in any form  Any candy containing nuts, coconut, seeds, peppermint, spearmint or dried or fresh fruit  Popcorn, potato chips, tortilla chips  Soft or hard thick pretzels, such as sourdough  Vegetables Well cooked soft vegetables without seeds or skins, such as asparagus tips, beets, carrots, green and wax beans, chopped spinach, tender canned baby peas, squash and pumpkin Raw vegetables, tomatoes, tomato juice, tomato sauce and V-8 juice(tomato based products can irritate the stomach)  Gas producing vegetables, such as broccoli, Brussel sprouts, cabbage, cauliflower, onions, corn, cucumber, green peppers, rutabagas, turnips, radishes and sauerkraut  Dried beans, peas and lentils  Miscellaneous Salt and spices in moderation  Mustard and vinegar in moderation Fried or highly seasoned foods  Coconut and seeds  Pickles and olives  Chili sauces, ketchup,  barbecue sauce, horseradish, black pepper, chili powder and onion and garlic seasonings  Any other strongly flavored seasoning, condiment, spice or herb not tolerated  Any food not tolerated

## 2024-01-25 ENCOUNTER — Encounter
Admission: RE | Admit: 2024-01-25 | Discharge: 2024-01-25 | Disposition: A | Discharge status: Home / Self Care | Payer: BC Managed Care – PPO | Source: Ambulatory Visit | Attending: Surgery | Admitting: Surgery

## 2024-01-25 HISTORY — DX: Gastro-esophageal reflux disease without esophagitis: K21.9

## 2024-01-25 HISTORY — DX: Unspecified asthma, uncomplicated: J45.909

## 2024-01-25 HISTORY — DX: Personal history of other diseases of the digestive system: Z87.19

## 2024-01-25 HISTORY — DX: Obesity, unspecified: E66.9

## 2024-01-25 HISTORY — DX: Hyperlipidemia, unspecified: E78.5

## 2024-01-25 HISTORY — DX: Chronic cough: R05.3

## 2024-01-25 NOTE — Patient Instructions (Addendum)
Your procedure is scheduled on:02-02-24 Thursday Report to the Registration Desk on the 1st floor of the [facility].Then proceed to the 2nd floor Surgery Desk To find out your arrival time, please call [phone] between 1PM - 3PM on:02-01-24 Wednesday If your arrival time is 6:00 am, do not arrive before that time as the [facility] entrance doors do not open until 6:00 am.  REMEMBER: Instructions that are not followed completely may result in serious medical risk, up to and including death; or upon the discretion of your surgeon and anesthesiologist your surgery may need to be rescheduled.  Do not eat food after midnight the night before surgery.  No gum chewing or hard candies.  You may however, drink CLEAR liquids up to 2 hours before you are scheduled to arrive for your surgery. Do not drink anything within 2 hours of your scheduled arrival time.  Clear liquids include: - water  - apple juice without pulp - gatorade (not RED colors) - black coffee or tea (Do NOT add milk or creamers to the coffee or tea) Do NOT drink anything that is not on this list.  One week prior to surgery:Stop NOW (01-25-24) Stop Anti-inflammatories (NSAIDS) such as Advil, Aleve, Ibuprofen, Motrin, Naproxen, Naprosyn and Aspirin based products such as Excedrin, Goody's Powder, BC Powder. Stop ANY OVER THE COUNTER supplements until after surgery (Magnesium, Multivitamin, Potassium, Vitamin D, Benefiber)-you may start Miralax or a stool softener (Colace) until you can restart your Benefiber  You may however, continue to take Tylenol if needed for pain up until the day of surgery.  Continue taking all of your other prescription medications up until the day of surgery.  Do NOT take any medication the day of surgery  Use your Albuterol-Budesonide (AIRSUPRA) Inhaler the day of surgery and bring your Inhaler to the hospital  No Alcohol for 24 hours before or after surgery.  No Smoking including e-cigarettes  for 24 hours before surgery.  No chewable tobacco products for at least 6 hours before surgery.  No nicotine patches on the day of surgery.  Do not use any "recreational" drugs for at least a week (preferably 2 weeks) before your surgery.  Please be advised that the combination of cocaine and anesthesia may have negative outcomes, up to and including death. If you test positive for cocaine, your surgery will be cancelled.  On the morning of surgery brush your teeth with toothpaste and water, you may rinse your mouth with mouthwash if you wish. Do not swallow any toothpaste or mouthwash.  Use CHG Soap as directed on instruction sheet.  Do not wear jewelry, make-up, hairpins, clips or nail polish.  For welded (permanent) jewelry: bracelets, anklets, waist bands, etc.  Please have this removed prior to surgery.  If it is not removed, there is a chance that hospital personnel will need to cut it off on the day of surgery.  Do not wear lotions, powders, or perfumes.   Do not shave body hair from the neck down 48 hours before surgery.  Contact lenses, hearing aids and dentures may not be worn into surgery.  Do not bring valuables to the hospital. [facility] is not responsible for any missing/lost belongings or valuables.   Notify your doctor if there is any change in your medical condition (cold, fever, infection).  Wear comfortable clothing (specific to your surgery type) to the hospital.  After surgery, you can help prevent lung complications by doing breathing exercises.  Take deep breaths and cough  every 1-2 hours. Your doctor may order a device called an Incentive Spirometer to help you take deep breaths. When coughing or sneezing, hold a pillow firmly against your incision with both hands. This is called "splinting." Doing this helps protect your incision. It also decreases belly discomfort.  If you are being admitted to the hospital overnight, leave your suitcase in the car. After  surgery it may be brought to your room.  In case of increased patient census, it may be necessary for you, the patient, to continue your postoperative care in the Same Day Surgery department.  If you are being discharged the day of surgery, you will not be allowed to drive home. You will need a responsible individual to drive you home and stay with you for 24 hours after surgery.   If you are taking public transportation, you will need to have a responsible individual with you.  Please call the Pre-admissions Testing Dept. at [phone] if you have any questions about these instructions.  Surgery Visitation Policy:  Patients having surgery or a procedure may have two visitors.  Children under the age of 65 must have an adult with them who is not the patient.  Temporary Visitor Restrictions Due to increasing cases of flu, RSV and COVID-19: Children ages 30 and under will not be able to visit patients in [facility] hospitals under most circumstances.  Inpatient Visitation:    Visiting hours are 7 a.m. to 8 p.m. Up to four visitors are allowed at one time in a patient room. The visitors may rotate out with other people during the day.  One visitor age 25 or older may stay with the patient overnight and must be in the room by 8 p.m.     Preparing for Surgery with CHLORHEXIDINE GLUCONATE (CHG) Soap  Chlorhexidine Gluconate (CHG) Soap  o An antiseptic cleaner that kills germs and bonds with the skin to continue killing germs even after washing  o Used for showering the night before surgery and morning of surgery  Before surgery, you can play an important role by reducing the number of germs on your skin.  CHG (Chlorhexidine gluconate) soap is an antiseptic cleanser which kills germs and bonds with the skin to continue killing germs even after washing.  Please do not use if you have an allergy to CHG or antibacterial soaps. If your skin becomes reddened/irritated stop using the  CHG.  1. Shower the NIGHT BEFORE SURGERY and the MORNING OF SURGERY with CHG soap.  2. If you choose to wash your hair, wash your hair first as usual with your normal shampoo.  3. After shampooing, rinse your hair and body thoroughly to remove the shampoo.  4. Use CHG as you would any other liquid soap. You can apply CHG directly to the skin and wash gently with a scrungie or a clean washcloth.  5. Apply the CHG soap to your body only from the neck down. Do not use on open wounds or open sores. Avoid contact with your eyes, ears, mouth, and genitals (private parts). Wash face and genitals (private parts) with your normal soap.  6. Wash thoroughly, paying special attention to the area where your surgery will be performed.  7. Thoroughly rinse your body with warm water.  8. Do not shower/wash with your normal soap after using and rinsing off the CHG soap.  9. Pat yourself dry with a clean towel.  10. Wear clean pajamas to bed the night before surgery.  12.  Place clean sheets on your bed the night of your first shower and do not sleep with pets.  13. Shower again with the CHG soap on the day of surgery prior to arriving at the hospital.  14. Do not apply any deodorants/lotions/powders.  15. Please wear clean clothes to the hospital.

## 2024-02-02 ENCOUNTER — Other Ambulatory Visit: Payer: Self-pay

## 2024-02-02 ENCOUNTER — Ambulatory Visit: Payer: BC Managed Care – PPO | Admitting: Anesthesiology

## 2024-02-02 ENCOUNTER — Observation Stay
Admission: RE | Admit: 2024-02-02 | Discharge: 2024-02-03 | Disposition: A | Discharge status: Home / Self Care | Payer: BC Managed Care – PPO | Attending: Surgery | Admitting: Surgery

## 2024-02-02 ENCOUNTER — Encounter
Admission: RE | Disposition: A | Discharge status: Home / Self Care | Payer: Self-pay | Source: Home / Self Care | Attending: Surgery

## 2024-02-02 ENCOUNTER — Ambulatory Visit: Payer: BC Managed Care – PPO

## 2024-02-02 ENCOUNTER — Encounter: Payer: Self-pay | Admitting: Surgery

## 2024-02-02 DIAGNOSIS — Z8719 Personal history of other diseases of the digestive system: Principal | ICD-10-CM

## 2024-02-02 DIAGNOSIS — K219 Gastro-esophageal reflux disease without esophagitis: Secondary | ICD-10-CM | POA: Diagnosis not present

## 2024-02-02 DIAGNOSIS — K449 Diaphragmatic hernia without obstruction or gangrene: Principal | ICD-10-CM

## 2024-02-02 HISTORY — PX: XI ROBOTIC ASSISTED PARAESOPHAGEAL HERNIA REPAIR: SHX6871

## 2024-02-02 HISTORY — PX: INSERTION OF MESH: SHX5868

## 2024-02-02 LAB — PROTIME-INR
INR: 1.1 (ref 0.8–1.2)
Prothrombin Time: 14.6 s (ref 11.4–15.2)

## 2024-02-02 LAB — COMPREHENSIVE METABOLIC PANEL
ALT: 45 U/L — ABNORMAL HIGH (ref 0–44)
AST: 41 U/L (ref 15–41)
Albumin: 3.5 g/dL (ref 3.5–5.0)
Alkaline Phosphatase: 58 U/L (ref 38–126)
Anion gap: 10 (ref 5–15)
BUN: 9 mg/dL (ref 6–20)
CO2: 23 mmol/L (ref 22–32)
Calcium: 8.8 mg/dL — ABNORMAL LOW (ref 8.9–10.3)
Chloride: 107 mmol/L (ref 98–111)
Creatinine, Ser: 0.85 mg/dL (ref 0.44–1.00)
GFR, Estimated: >60 mL/min (ref >60)
Glucose, Bld: 129 mg/dL — ABNORMAL HIGH (ref 70–99)
Potassium: 4.5 mmol/L (ref 3.5–5.1)
Sodium: 140 mmol/L (ref 135–145)
Total Bilirubin: 0.4 mg/dL (ref 0.0–1.2)
Total Protein: 6.4 g/dL — ABNORMAL LOW (ref 6.5–8.1)

## 2024-02-02 LAB — TYPE AND SCREEN
ABO/RH(D): B POS
Antibody Screen: NEGATIVE

## 2024-02-02 SURGERY — REPAIR, HERNIA, PARAESOPHAGEAL, ROBOT-ASSISTED
Anesthesia: General | Site: Abdomen

## 2024-02-02 MED ORDER — ONDANSETRON HCL 4 MG/2ML IJ SOLN: 4.0000 mg | Freq: Once | INTRAMUSCULAR | Status: DC | PRN

## 2024-02-02 MED ORDER — ACETAMINOPHEN 500 MG PO TABS
ORAL_TABLET | ORAL | Status: AC
  Filled 2024-02-02: qty 2

## 2024-02-02 MED ORDER — HYDROMORPHONE HCL 1 MG/ML IJ SOLN
INTRAMUSCULAR | Status: DC | PRN
Start: 2024-02-02 — End: 2024-02-02
  Administered 2024-02-02 (×2): .5 mg via INTRAVENOUS

## 2024-02-02 MED ORDER — KETOROLAC TROMETHAMINE 30 MG/ML IJ SOLN
30.0000 mg | Freq: Four times a day (QID) | INTRAMUSCULAR | Status: DC | PRN
Start: 2024-02-07 — End: 2024-02-03

## 2024-02-02 MED ORDER — HYDRALAZINE HCL 20 MG/ML IJ SOLN: 10.0000 mg | INTRAMUSCULAR | Status: DC | PRN

## 2024-02-02 MED ORDER — BUPIVACAINE LIPOSOME 1.3 % IJ SUSP
INTRAMUSCULAR | Status: AC
  Filled 2024-02-02: qty 20

## 2024-02-02 MED ORDER — ACETAMINOPHEN 500 MG PO TABS
1000.0000 mg | ORAL_TABLET | ORAL | Status: AC
  Administered 2024-02-02: 1000 mg via ORAL

## 2024-02-02 MED ORDER — SCOPOLAMINE 1 MG/3DAYS TD PT72
1.0000 | MEDICATED_PATCH | TRANSDERMAL | Status: DC
  Administered 2024-02-02: 1.5 mg via TRANSDERMAL

## 2024-02-02 MED ORDER — ROCURONIUM BROMIDE 100 MG/10ML IV SOLN
INTRAVENOUS | Status: DC | PRN
  Administered 2024-02-02: 20 mg via INTRAVENOUS
  Administered 2024-02-02: 70 mg via INTRAVENOUS

## 2024-02-02 MED ORDER — CELECOXIB 200 MG PO CAPS
200.0000 mg | ORAL_CAPSULE | ORAL | Status: AC
  Administered 2024-02-02: 200 mg via ORAL

## 2024-02-02 MED ORDER — METHOCARBAMOL 500 MG PO TABS
500.0000 mg | ORAL_TABLET | Freq: Three times a day (TID) | ORAL | Status: DC | PRN
  Administered 2024-02-02: 500 mg via ORAL
  Filled 2024-02-02: qty 1

## 2024-02-02 MED ORDER — FENTANYL CITRATE (PF) 100 MCG/2ML IJ SOLN: 25.0000 ug | INTRAMUSCULAR | Status: DC | PRN

## 2024-02-02 MED ORDER — PROPOFOL 10 MG/ML IV BOLUS
INTRAVENOUS | Status: DC | PRN
  Administered 2024-02-02: 200 mg via INTRAVENOUS

## 2024-02-02 MED ORDER — SODIUM CHLORIDE 0.9 % IV SOLN: INTRAVENOUS | Status: DC

## 2024-02-02 MED ORDER — OXYCODONE HCL 5 MG PO TABS: 5.0000 mg | ORAL_TABLET | ORAL | Status: DC | PRN

## 2024-02-02 MED ORDER — HYDROMORPHONE HCL 1 MG/ML IJ SOLN
INTRAMUSCULAR | Status: AC
  Filled 2024-02-02: qty 1

## 2024-02-02 MED ORDER — ONDANSETRON 4 MG PO TBDP: 4.0000 mg | ORAL_TABLET | Freq: Four times a day (QID) | ORAL | Status: DC | PRN

## 2024-02-02 MED ORDER — OXYCODONE HCL 5 MG/5ML PO SOLN: 5.0000 mg | Freq: Once | ORAL | Status: DC | PRN

## 2024-02-02 MED ORDER — LACTATED RINGERS IV SOLN: INTRAVENOUS | Status: DC

## 2024-02-02 MED ORDER — CEFAZOLIN SODIUM-DEXTROSE 2-4 GM/100ML-% IV SOLN
2.0000 g | Freq: Three times a day (TID) | INTRAVENOUS | Status: DC
  Administered 2024-02-02 – 2024-02-03 (×3): 2 g via INTRAVENOUS
  Filled 2024-02-02 (×2): qty 100

## 2024-02-02 MED ORDER — CHLORHEXIDINE GLUCONATE CLOTH 2 % EX PADS
6.0000 | MEDICATED_PAD | Freq: Once | CUTANEOUS | Status: AC
  Administered 2024-02-02: 6 via TOPICAL

## 2024-02-02 MED ORDER — STERILE WATER FOR IRRIGATION IR SOLN
Status: DC | PRN
  Administered 2024-02-02: 3000 mL

## 2024-02-02 MED ORDER — METHYLENE BLUE (ANTIDOTE) 1 % IV SOLN
INTRAVENOUS | Status: AC
  Filled 2024-02-02: qty 10

## 2024-02-02 MED ORDER — KETOROLAC TROMETHAMINE 30 MG/ML IJ SOLN
INTRAMUSCULAR | Status: AC
  Filled 2024-02-02: qty 1

## 2024-02-02 MED ORDER — ACETAMINOPHEN 500 MG PO TABS
1000.0000 mg | ORAL_TABLET | Freq: Three times a day (TID) | ORAL | Status: DC
  Administered 2024-02-02 – 2024-02-03 (×3): 1000 mg via ORAL
  Filled 2024-02-02 (×2): qty 2

## 2024-02-02 MED ORDER — MORPHINE SULFATE (PF) 2 MG/ML IV SOLN: 2.0000 mg | INTRAVENOUS | Status: DC | PRN

## 2024-02-02 MED ORDER — LIDOCAINE HCL (CARDIAC) PF 100 MG/5ML IV SOSY
PREFILLED_SYRINGE | INTRAVENOUS | Status: DC | PRN
  Administered 2024-02-02: 60 mg via INTRAVENOUS

## 2024-02-02 MED ORDER — ONDANSETRON HCL 4 MG/2ML IJ SOLN
4.0000 mg | Freq: Four times a day (QID) | INTRAMUSCULAR | Status: DC | PRN
  Administered 2024-02-02: 4 mg via INTRAVENOUS
  Filled 2024-02-02: qty 2

## 2024-02-02 MED ORDER — PHENYLEPHRINE 80 MCG/ML (10ML) SYRINGE FOR IV PUSH (FOR BLOOD PRESSURE SUPPORT)
PREFILLED_SYRINGE | INTRAVENOUS | Status: DC | PRN
  Administered 2024-02-02: 160 ug via INTRAVENOUS

## 2024-02-02 MED ORDER — MELATONIN 5 MG PO TABS: 5.0000 mg | ORAL_TABLET | Freq: Every evening | ORAL | Status: DC | PRN

## 2024-02-02 MED ORDER — BUPIVACAINE-EPINEPHRINE (PF) 0.25% -1:200000 IJ SOLN
INTRAMUSCULAR | Status: AC
  Filled 2024-02-02: qty 30

## 2024-02-02 MED ORDER — OXYCODONE HCL 5 MG PO TABS: 5.0000 mg | ORAL_TABLET | Freq: Once | ORAL | Status: DC | PRN

## 2024-02-02 MED ORDER — ONDANSETRON HCL 4 MG/2ML IJ SOLN
INTRAMUSCULAR | Status: DC | PRN
  Administered 2024-02-02: 4 mg via INTRAVENOUS

## 2024-02-02 MED ORDER — BUPIVACAINE-EPINEPHRINE 0.25% -1:200000 IJ SOLN
INTRAMUSCULAR | Status: DC | PRN
  Administered 2024-02-02: 50 mL via INTRAMUSCULAR

## 2024-02-02 MED ORDER — DEXAMETHASONE SODIUM PHOSPHATE 10 MG/ML IJ SOLN
INTRAMUSCULAR | Status: DC | PRN
  Administered 2024-02-02: 5 mg via INTRAVENOUS

## 2024-02-02 MED ORDER — SCOPOLAMINE 1 MG/3DAYS TD PT72
MEDICATED_PATCH | TRANSDERMAL | Status: AC
  Filled 2024-02-02: qty 1

## 2024-02-02 MED ORDER — GABAPENTIN 300 MG PO CAPS
300.0000 mg | ORAL_CAPSULE | ORAL | Status: AC
  Administered 2024-02-02: 300 mg via ORAL

## 2024-02-02 MED ORDER — EPHEDRINE SULFATE-NACL 50-0.9 MG/10ML-% IV SOSY
PREFILLED_SYRINGE | INTRAVENOUS | Status: DC | PRN
  Administered 2024-02-02: 10 mg via INTRAVENOUS

## 2024-02-02 MED ORDER — PHENYLEPHRINE 80 MCG/ML (10ML) SYRINGE FOR IV PUSH (FOR BLOOD PRESSURE SUPPORT)
PREFILLED_SYRINGE | INTRAVENOUS | Status: AC
  Filled 2024-02-02: qty 10

## 2024-02-02 MED ORDER — CHLORHEXIDINE GLUCONATE CLOTH 2 % EX PADS: 6.0000 | MEDICATED_PAD | Freq: Once | CUTANEOUS | Status: DC

## 2024-02-02 MED ORDER — FENTANYL CITRATE (PF) 100 MCG/2ML IJ SOLN
INTRAMUSCULAR | Status: AC
  Filled 2024-02-02: qty 2

## 2024-02-02 MED ORDER — DIPHENHYDRAMINE HCL 50 MG/ML IJ SOLN: 12.5000 mg | Freq: Four times a day (QID) | INTRAMUSCULAR | Status: DC | PRN

## 2024-02-02 MED ORDER — PROPOFOL 10 MG/ML IV BOLUS
INTRAVENOUS | Status: AC
  Filled 2024-02-02: qty 20

## 2024-02-02 MED ORDER — KETOROLAC TROMETHAMINE 30 MG/ML IJ SOLN
30.0000 mg | Freq: Four times a day (QID) | INTRAMUSCULAR | Status: DC
  Administered 2024-02-02 – 2024-02-03 (×4): 30 mg via INTRAVENOUS

## 2024-02-02 MED ORDER — ORAL CARE MOUTH RINSE
15.0000 mL | Freq: Once | OROMUCOSAL | Status: AC
Start: 2024-02-02 — End: 2024-02-02

## 2024-02-02 MED ORDER — VISTASEAL 10 ML SINGLE DOSE KIT
PACK | CUTANEOUS | Status: DC | PRN
  Administered 2024-02-02: 10 mL via TOPICAL

## 2024-02-02 MED ORDER — ENOXAPARIN SODIUM 40 MG/0.4ML IJ SOSY
40.0000 mg | PREFILLED_SYRINGE | INTRAMUSCULAR | Status: DC
  Administered 2024-02-03: 40 mg via SUBCUTANEOUS

## 2024-02-02 MED ORDER — DEXAMETHASONE SODIUM PHOSPHATE 10 MG/ML IJ SOLN
INTRAMUSCULAR | Status: AC
  Filled 2024-02-02: qty 1

## 2024-02-02 MED ORDER — SUGAMMADEX SODIUM 200 MG/2ML IV SOLN
INTRAVENOUS | Status: DC | PRN
  Administered 2024-02-02: 200 mg via INTRAVENOUS

## 2024-02-02 MED ORDER — 0.9 % SODIUM CHLORIDE (POUR BTL) OPTIME
TOPICAL | Status: DC | PRN
  Administered 2024-02-02: 500 mL

## 2024-02-02 MED ORDER — MIDAZOLAM HCL 2 MG/2ML IJ SOLN
INTRAMUSCULAR | Status: AC
  Filled 2024-02-02: qty 2

## 2024-02-02 MED ORDER — CEFAZOLIN SODIUM-DEXTROSE 2-4 GM/100ML-% IV SOLN
2.0000 g | INTRAVENOUS | Status: AC
  Administered 2024-02-02: 2 g via INTRAVENOUS

## 2024-02-02 MED ORDER — ONDANSETRON HCL 4 MG/2ML IJ SOLN
INTRAMUSCULAR | Status: AC
  Filled 2024-02-02: qty 2

## 2024-02-02 MED ORDER — METHOCARBAMOL 1000 MG/10ML IJ SOLN: 500.0000 mg | Freq: Three times a day (TID) | INTRAMUSCULAR | Status: DC | PRN

## 2024-02-02 MED ORDER — PROCHLORPERAZINE EDISYLATE 10 MG/2ML IJ SOLN
5.0000 mg | Freq: Four times a day (QID) | INTRAMUSCULAR | Status: DC | PRN
  Administered 2024-02-02: 10 mg via INTRAVENOUS
  Filled 2024-02-02 (×2): qty 2

## 2024-02-02 MED ORDER — CEFAZOLIN SODIUM-DEXTROSE 2-4 GM/100ML-% IV SOLN
INTRAVENOUS | Status: AC
  Filled 2024-02-02: qty 100

## 2024-02-02 MED ORDER — GABAPENTIN 300 MG PO CAPS
ORAL_CAPSULE | ORAL | Status: AC
  Filled 2024-02-02: qty 1

## 2024-02-02 MED ORDER — LACTATED RINGERS IV SOLN: INTRAVENOUS | Status: DC | PRN

## 2024-02-02 MED ORDER — CHLORHEXIDINE GLUCONATE 0.12 % MT SOLN
15.0000 mL | Freq: Once | OROMUCOSAL | Status: AC
  Administered 2024-02-02: 15 mL via OROMUCOSAL

## 2024-02-02 MED ORDER — CELECOXIB 200 MG PO CAPS
ORAL_CAPSULE | ORAL | Status: AC
  Filled 2024-02-02: qty 1

## 2024-02-02 MED ORDER — DIPHENHYDRAMINE HCL 12.5 MG/5ML PO ELIX: 12.5000 mg | ORAL_SOLUTION | Freq: Four times a day (QID) | ORAL | Status: DC | PRN

## 2024-02-02 MED ORDER — FENTANYL CITRATE (PF) 100 MCG/2ML IJ SOLN
INTRAMUSCULAR | Status: DC | PRN
  Administered 2024-02-02 (×2): 50 ug via INTRAVENOUS

## 2024-02-02 MED ORDER — MIDAZOLAM HCL 2 MG/2ML IJ SOLN
INTRAMUSCULAR | Status: DC | PRN
  Administered 2024-02-02: 2 mg via INTRAVENOUS

## 2024-02-02 MED ORDER — SEVOFLURANE IN SOLN
RESPIRATORY_TRACT | Status: AC
  Filled 2024-02-02: qty 250

## 2024-02-02 MED ORDER — CHLORHEXIDINE GLUCONATE 0.12 % MT SOLN
OROMUCOSAL | Status: AC
  Filled 2024-02-02: qty 15

## 2024-02-02 MED ORDER — PROCHLORPERAZINE MALEATE 10 MG PO TABS: 10.0000 mg | ORAL_TABLET | Freq: Four times a day (QID) | ORAL | Status: DC | PRN

## 2024-02-02 MED ORDER — LIDOCAINE HCL (PF) 2 % IJ SOLN
INTRAMUSCULAR | Status: AC
  Filled 2024-02-02: qty 5

## 2024-02-02 SURGICAL SUPPLY — 52 items
APPLICATOR VISTASEAL 35 (MISCELLANEOUS) IMPLANT
CANNULA REDUCER 12-8 DVNC XI (CANNULA) ×1 IMPLANT
CLIP LIGATING HEM O LOK PURPLE (MISCELLANEOUS) IMPLANT
DERMABOND ADVANCED .7 DNX12 (GAUZE/BANDAGES/DRESSINGS) ×1 IMPLANT
DRAPE ARM DVNC X/XI (DISPOSABLE) ×4 IMPLANT
DRAPE COLUMN DVNC XI (DISPOSABLE) ×1 IMPLANT
ELECT REM PT RETURN 9FT ADLT (ELECTROSURGICAL) ×1
ELECTRODE REM PT RTRN 9FT ADLT (ELECTROSURGICAL) ×1 IMPLANT
FORCEPS BPLR R/ABLATION 8 DVNC (INSTRUMENTS) ×1 IMPLANT
GLOVE BIO SURGEON STRL SZ7 (GLOVE) ×3 IMPLANT
GOWN STRL REUS W/ TWL LRG LVL3 (GOWN DISPOSABLE) ×4 IMPLANT
GRASPER LAPSCPC 5X45 DSP (INSTRUMENTS) ×1 IMPLANT
GRASPER TIP-UP FEN DVNC XI (INSTRUMENTS) ×1 IMPLANT
IRRIGATION STRYKERFLOW (MISCELLANEOUS) IMPLANT
IRRIGATOR STRYKERFLOW (MISCELLANEOUS) ×1
IV NS 1000ML BAXH (IV SOLUTION) IMPLANT
KIT IMAGING PINPOINTPAQ (MISCELLANEOUS) ×1 IMPLANT
KIT PINK PAD W/HEAD ARE REST (MISCELLANEOUS) ×1
KIT PINK PAD W/HEAD ARM REST (MISCELLANEOUS) ×1 IMPLANT
LABEL OR SOLS (LABEL) ×1 IMPLANT
MANIFOLD NEPTUNE II (INSTRUMENTS) ×1 IMPLANT
MESH BIO-A 7X10 SYN MAT (Mesh General) IMPLANT
NDL DRIVE SUT CUT DVNC (INSTRUMENTS) ×1 IMPLANT
NDL HYPO 22X1.5 SAFETY MO (MISCELLANEOUS) ×1 IMPLANT
NEEDLE DRIVE SUT CUT DVNC (INSTRUMENTS) ×1
NEEDLE HYPO 22X1.5 SAFETY MO (MISCELLANEOUS) ×1
OBTURATOR OPTICAL STND 8 DVNC (TROCAR) ×1
OBTURATOR OPTICALSTD 8 DVNC (TROCAR) ×1 IMPLANT
PACK LAP CHOLECYSTECTOMY (MISCELLANEOUS) ×1 IMPLANT
SEAL UNIV 5-12 XI (MISCELLANEOUS) ×4 IMPLANT
SEALER VESSEL EXT DVNC XI (MISCELLANEOUS) ×1 IMPLANT
SOL ELECTROSURG ANTI STICK (MISCELLANEOUS) ×1
SOLUTION ELECTROSURG ANTI STCK (MISCELLANEOUS) ×1 IMPLANT
SPIKE FLUID TRANSFER (MISCELLANEOUS) ×1 IMPLANT
SPONGE T-LAP 18X18 ~~LOC~~+RFID (SPONGE) ×1 IMPLANT
SUT MNCRL 4-0 27 PS-2 XMFL (SUTURE) ×2
SUT SILK 2 0 SH (SUTURE) ×2 IMPLANT
SUT STRATA 2-0 23CM CT-2 (SUTURE) ×1 IMPLANT
SUT VIC AB 3-0 SH 27X BRD (SUTURE) IMPLANT
SUT VICRYL 0 UR6 27IN ABS (SUTURE) ×2 IMPLANT
SUTURE MNCRL 4-0 27XMF (SUTURE) ×1 IMPLANT
SYR 30ML LL (SYRINGE) ×1 IMPLANT
SYR TOOMEY IRRIG 70ML (MISCELLANEOUS) ×1
SYRINGE TOOMEY IRRIG 70ML (MISCELLANEOUS) ×1 IMPLANT
SYS BAG RETRIEVAL 10MM (BASKET)
SYSTEM BAG RETRIEVAL 10MM (BASKET) IMPLANT
TRAP FLUID SMOKE EVACUATOR (MISCELLANEOUS) ×1 IMPLANT
TRAY FOLEY SLVR 16FR LF STAT (SET/KITS/TRAYS/PACK) ×1 IMPLANT
TROCAR Z-THREAD FIOS 5X100MM (TROCAR) ×1 IMPLANT
TUBING EVAC SMOKE HEATED PNEUM (TUBING) ×1 IMPLANT
WATER STERILE IRR 3000ML UROMA (IV SOLUTION) IMPLANT
WATER STERILE IRR 500ML POUR (IV SOLUTION) ×1 IMPLANT

## 2024-02-02 NOTE — Progress Notes (Signed)
 Patient is doing very well in PACU. Resting comfortably in bed, 3 liters/min o2 via nasal cannula, pain 0/10 at this time on a pain scale from 0-10. Has had ice chips and tolerated well. Chest XRAY completed at bedside. Updated family, waiting on room assignment.

## 2024-02-02 NOTE — Anesthesia Procedure Notes (Signed)
 Procedure Name: Intubation Date/Time: 02/02/2024 7:38 AM  Performed by: Niki Manus SAUNDERS, CRNAPre-anesthesia Checklist: Patient identified, Patient being monitored, Timeout performed, Emergency Drugs available and Suction available Patient Re-evaluated:Patient Re-evaluated prior to induction Oxygen Delivery Method: Circle system utilized Preoxygenation: Pre-oxygenation with 100% oxygen Induction Type: IV induction Ventilation: Mask ventilation without difficulty Laryngoscope Size: Mac and 4 Grade View: Grade I Tube type: Oral Tube size: 6.5 mm Number of attempts: 1 Airway Equipment and Method: Stylet Placement Confirmation: ETT inserted through vocal cords under direct vision, positive ETCO2 and breath sounds checked- equal and bilateral Secured at: 21 cm Tube secured with: Tape Dental Injury: Teeth and Oropharynx as per pre-operative assessment

## 2024-02-02 NOTE — Op Note (Addendum)
 Robotic assisted laparoscopic repair of  paraesophageal  hernia with Bio-A Mesh and Nissen fundoplication  Pre-operative Diagnosis: GERD, hiatal hernia  Post-operative Diagnosis: same  Procedure:  Robotic assisted laparoscopic repair of  paraesophageal  hernia with Bio-A Mesh and Nissen fundoplication  Surgeon: Laneta Luna, MD FACS  Assistant: Ronal Landry Pouch, RNFA Required due to the complexity of the case the need for exposure and lack of first assist.  Anesthesia: Gen. with endotracheal tube  Findings: Liver cirrhosis, no ascites or signs of portal HTN. 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 The benefits, complications, treatment options, and expected outcomes were discussed with the patient. The risks of bleeding, infection, recurrence of symptoms, failure to resolve symptoms,  esophageal damage, Dysphagia, bowel injury, any of which could require further surgery were reviewed with the patient. The likelihood of improving the patient's symptoms with return to their baseline status is good.  The patient and/or family concurred with the proposed plan, giving informed consent.  The patient was taken to Operating Room, identified  and the procedure verified.  A Time Out was held and the above information confirmed.  Prior to the induction of general anesthesia, antibiotic prophylaxis was administered. VTE prophylaxis was in place. General endotracheal anesthesia was then administered and tolerated well. After the induction, the abdomen was prepped with Chloraprep and draped in the sterile fashion.  The patient was positioned in the supine position.  Cut down technique was used to enter the abdominal cavity and a Hasson trochar was placed after two vicryl stitches were anchored to the fascia. Pneumoperitoneum was then created with CO2 and tolerated well without any adverse changes in the patient's vital signs.  Three 8-mm ports were placed under direct vision. All skin incisions  were infiltrated with a local anesthetic agent before making the incision and placing the trocars. An additional 5 mm regular laparoscopic port was placed to assist with retraction and exposure.   The patient was positioned  in reverse Trendelenburg, robot was brought to the surgical field and docked in the standard fashion.  We made sure all the instrumentation was kept indirect view at all times and that there were no collision between the arms. I scrubbed out and went to the console. I saw liver cirrhosis w/o evidence of portal HTN we immediately sent cmp, INR and CBC to assess her liver fx. IT came back normal therefore we proceeded with the operation.  I used a robotic arm to retract the liver, the vessel sealer on my right hand and a forced bipolar grasper on my left hand.  There is along the extra 5 mm port allow me ample exposure and the ability to perform meticulous dissection  We Started dividing the lesser omentum via the pars flaccida.  We Were able to dissect the lesser curvature of the stomach and  dissected the fundus free from the right and left crus.  We circumferentially dissected the GE junction.  The hernia sac was also completely reduced and we were able to bring the stomach into the intra-abdominal position.  Attention  then was turned to the greater curvature where the short gastrics were divided with sealer device.  We were able to identify the left crus and again were able to make sure there was a good circumferential dissection and that the hernia sac was completely excised.  We did perform a good  dissection within the mediastinum to allow a complete reduction of the sac, gain esophageal length and a to completely allow an intra-abdominal fundoplication. We had 3 cms of intra-abdominal esophagus. We also asked anesthesia to place Methylene blue  via OG tube and no evidence of gastric or esophageal injuries observed. Using two strips of Bio-A as pledgets we approximated the crus with a 2-0 stratafix suture. Given that she did have a generous hiatus I decided to use absorbable mesh. A bio-A 10x7 cm mesh was inserted and secured using Vistaseal .   We Asked anesthesia to place a 50 French bougie and this went easily.  We also observe trajectory of the bougie. 360 degree Nissen fundoplication was created with multiple 2-0 silk sutures and we placed 3 stitches taking some of the esophagus within that bite.  The fundoplication measured approximately 3-1/2 cm and he was floppy. I was very happy with the way the fundoplication laid and the repair of the hernia.  Inspection of the  upper quadrant was performed. No bleeding, bile  Or esophageal injuries leaks, or bowel injuries were noted. Robotic instruments and robotic arms were undocked in the standard fashion. All the needles were removed under direct visualization.   I scrubbed back in.  Pneumoperitoneum was released.  The periumbilical port site was closed with interrumpted 0 Vicryl sutures. 4-0 subcuticular Monocryl was used to close the skin. Liposomal marcaine  was injected to all the incisions sites.  Dermabond was  applied.  The patient was then extubated and brought to the recovery room in stable condition. Sponge, lap, and needle counts were correct at closure and at the conclusion of the case.           Media Information    Document Information  Operative Procedure Images    02/02/2024 10:11  Attached To:  Hospital Encounter on 02/02/24  Source Information  Default, Provider, MD  Document History         Laneta Luna, MD, FACS

## 2024-02-02 NOTE — Interval H&P Note (Signed)
 History and Physical Interval Note:  02/02/2024 7:24 AM  Joyce Stevenson  has presented today for surgery, with the diagnosis of hiatal hernia.  The various methods of treatment have been discussed with the patient and family. After consideration of risks, benefits and other options for treatment, the patient has consented to  Procedure(s): XI ROBOTIC ASSISTED PARAESOPHAGEAL HERNIA REPAIR, RNFA to assist (N/A) as a surgical intervention.  The patient's history has been reviewed, patient examined, no change in status, stable for surgery.  I have reviewed the patient's chart and labs.  Questions were answered to the patient's satisfaction.     Tavarious Freel F [name]

## 2024-02-02 NOTE — Anesthesia Preprocedure Evaluation (Signed)
 Anesthesia Evaluation  Patient identified by MRN, date of birth, ID band Patient awake    Reviewed: Allergy & Precautions, NPO status , Patient's Chart, lab work & pertinent test results  History of Anesthesia Complications Negative for: history of anesthetic complications  Airway Mallampati: II  TM Distance: >3 FB Neck ROM: Full    Dental no notable dental hx. (+) Teeth Intact   Pulmonary asthma , neg sleep apnea, neg COPD, Patient abstained from smoking.Not current smoker Mild asthma   Pulmonary exam normal breath sounds clear to auscultation       Cardiovascular Exercise Tolerance: Good METS(-) hypertension(-) CAD and (-) Past MI negative cardio ROS (-) dysrhythmias  Rhythm:Regular Rate:Normal - Systolic murmurs    Neuro/Psych negative neurological ROS  negative psych ROS   GI/Hepatic hiatal hernia,GERD  Medicated,,(+)     (-) substance abuse    Endo/Other  neg diabetes    Renal/GU negative Renal ROS     Musculoskeletal   Abdominal   Peds  Hematology   Anesthesia Other Findings Past Medical History: No date: Asthma No date: Chronic cough No date: GERD (gastroesophageal reflux disease) No date: History of hiatal hernia No date: Hyperlipidemia No date: Obesity No date: Uterine fibroid  Reproductive/Obstetrics                             Anesthesia Physical Anesthesia Plan  ASA: 2  Anesthesia Plan: General   Post-op Pain Management: Tylenol  PO (pre-op)*, Gabapentin  PO (pre-op)* and Celebrex  PO (pre-op)*   Induction: Intravenous and Rapid sequence  PONV Risk Score and Plan: 4 or greater and Ondansetron , Dexamethasone , Midazolam  and Scopolamine  patch - Pre-op  Airway Management Planned: Oral ETT and Video Laryngoscope Planned  Additional Equipment: None  Intra-op Plan:   Post-operative Plan: Extubation in OR  Informed Consent: I have reviewed the patients History and  Physical, chart, labs and discussed the procedure including the risks, benefits and alternatives for the proposed anesthesia with the patient or authorized representative who has indicated his/her understanding and acceptance.     Dental advisory given  Plan Discussed with: CRNA and Surgeon  Anesthesia Plan Comments: (Discussed risks of anesthesia with patient, including PONV, sore throat, lip/dental/eye damage. Rare risks discussed as well, such as cardiorespiratory and neurological sequelae, and allergic reactions. Discussed the role of CRNA in patient's perioperative care. Patient understands.)        Anesthesia Quick Evaluation

## 2024-02-02 NOTE — Transfer of Care (Signed)
 Immediate Anesthesia Transfer of Care Note  Patient: Joyce Stevenson  Procedure(s) Performed: XI ROBOTIC ASSISTED PARAESOPHAGEAL HERNIA REPAIR, RNFA to assist (Abdomen)  Patient Location: PACU  Anesthesia Type:General  Level of Consciousness: awake and alert   Airway & Oxygen Therapy: Patient Spontanous Breathing and Patient connected to nasal cannula oxygen  Post-op Assessment: Report given to RN and Post -op Vital signs reviewed and stable  Post vital signs: Reviewed and stable  Last Vitals:  Vitals Value Taken Time  BP 151/85 02/02/24 1023  Temp    Pulse 95 02/02/24 1030  Resp 13 02/02/24 1030  SpO2 96 % 02/02/24 1030  Vitals shown include unfiled device data.  Last Pain:  Vitals:   02/02/24 0656  TempSrc: Temporal  PainSc: 0-No pain         Complications: No notable events documented.

## 2024-02-03 ENCOUNTER — Encounter: Payer: Self-pay | Admitting: Surgery

## 2024-02-03 DIAGNOSIS — K449 Diaphragmatic hernia without obstruction or gangrene: Secondary | ICD-10-CM | POA: Diagnosis not present

## 2024-02-03 LAB — CBC
HCT: 41.3 % (ref 36.0–46.0)
Hemoglobin: 14.3 g/dL (ref 12.0–15.0)
MCH: 30.7 pg (ref 26.0–34.0)
MCHC: 34.6 g/dL (ref 30.0–36.0)
MCV: 88.6 fL (ref 80.0–100.0)
Platelets: 176 10*3/uL (ref 150–400)
RBC: 4.66 MIL/uL (ref 3.87–5.11)
RDW: 11.9 % (ref 11.5–15.5)
WBC: 13 10*3/uL — ABNORMAL HIGH (ref 4.0–10.5)
nRBC: 0 % (ref 0.0–0.2)

## 2024-02-03 LAB — COMPREHENSIVE METABOLIC PANEL
ALT: 96 U/L — ABNORMAL HIGH (ref 0–44)
AST: 127 U/L — ABNORMAL HIGH (ref 15–41)
Albumin: 3.3 g/dL — ABNORMAL LOW (ref 3.5–5.0)
Alkaline Phosphatase: 51 U/L (ref 38–126)
Anion gap: 10 (ref 5–15)
BUN: 12 mg/dL (ref 6–20)
CO2: 24 mmol/L (ref 22–32)
Calcium: 8.7 mg/dL — ABNORMAL LOW (ref 8.9–10.3)
Chloride: 106 mmol/L (ref 98–111)
Creatinine, Ser: 0.95 mg/dL (ref 0.44–1.00)
GFR, Estimated: >60 mL/min (ref >60)
Glucose, Bld: 162 mg/dL — ABNORMAL HIGH (ref 70–99)
Potassium: 3.7 mmol/L (ref 3.5–5.1)
Sodium: 140 mmol/L (ref 135–145)
Total Bilirubin: 0.6 mg/dL (ref 0.0–1.2)
Total Protein: 6.2 g/dL — ABNORMAL LOW (ref 6.5–8.1)

## 2024-02-03 LAB — HIV ANTIBODY (ROUTINE TESTING W REFLEX): HIV Screen 4th Generation wRfx: NONREACTIVE

## 2024-02-03 LAB — MAGNESIUM: Magnesium: 1.9 mg/dL (ref 1.7–2.4)

## 2024-02-03 MED ORDER — ACETAMINOPHEN 500 MG PO TABS
ORAL_TABLET | ORAL | Status: AC
  Filled 2024-02-03: qty 2

## 2024-02-03 MED ORDER — ENOXAPARIN SODIUM 40 MG/0.4ML IJ SOSY
PREFILLED_SYRINGE | INTRAMUSCULAR | Status: AC
  Filled 2024-02-03: qty 0.4

## 2024-02-03 MED ORDER — ONDANSETRON HCL 4 MG PO TABS: 4.0000 mg | ORAL_TABLET | Freq: Four times a day (QID) | ORAL | 0 refills | Status: DC | PRN

## 2024-02-03 MED ORDER — KETOROLAC TROMETHAMINE 30 MG/ML IJ SOLN
INTRAMUSCULAR | Status: AC
  Filled 2024-02-03: qty 1

## 2024-02-03 MED ORDER — CEFAZOLIN SODIUM-DEXTROSE 2-4 GM/100ML-% IV SOLN
INTRAVENOUS | Status: AC
  Filled 2024-02-03: qty 100

## 2024-02-03 MED ORDER — OXYCODONE HCL 5 MG PO TABS: 5.0000 mg | ORAL_TABLET | ORAL | 0 refills | Status: DC | PRN

## 2024-02-03 NOTE — Anesthesia Postprocedure Evaluation (Signed)
 Anesthesia Post Note  Patient: Joyce Stevenson  Procedure(s) Performed: XI ROBOTIC ASSISTED PARAESOPHAGEAL HERNIA REPAIR, RNFA to assist (Abdomen) INSERTION OF MESH (Abdomen)  Patient location during evaluation: PACU Anesthesia Type: General Level of consciousness: awake and alert Pain management: pain level controlled Vital Signs Assessment: post-procedure vital signs reviewed and stable Respiratory status: spontaneous breathing, nonlabored ventilation, respiratory function stable and patient connected to nasal cannula oxygen Cardiovascular status: blood pressure returned to baseline and stable Postop Assessment: no apparent nausea or vomiting Anesthetic complications: no   No notable events documented.   Last Vitals:  Vitals:   02/03/24 1103 02/03/24 1210  BP:  129/63  Pulse: 87 68  Resp:  16  Temp:  36.7 C  SpO2: 95% 94%    Last Pain:  Vitals:   02/03/24 1210  TempSrc: Temporal  PainSc:                  Rome Ade

## 2024-02-03 NOTE — Progress Notes (Signed)
 Patient discharging home, instructions given to patient, verbalized understanding. IV's removed. Incisions clean dry and intact with liquid skin glue/adhesive. Family transporting patient home.

## 2024-02-03 NOTE — Discharge Instructions (Signed)
 Surgery to Stop Reflux (Open Nissen Fundoplication): What to Expect  Open Nissen fundoplication is a surgery to help with symptoms of gastroesophageal reflux disease (GERD). In GERD, stomach contents and acid back up into your esophagus. Your esophagus is the part of your body that moves food from your mouth to your stomach. Normally, the muscle between your stomach and esophagus keeps stomach contents and acid in your stomach. If you have GERD, that muscle doesn't work like it should. This means stomach contents and acid may flow back up (reflux). You may need this surgery if other treatments for GERD haven't helped. In this surgery, the upper part of your stomach is wrapped around the lower end of your esophagus and stitched together. Tell your health care provider about: Any allergies you have. All medicines you take. These include vitamins, herbs, eye drops, and creams. Any problems you or family members have had with anesthesia. Any bleeding problems you have. Any surgeries you've had. Any medical problems you have. Whether you're pregnant or may be pregnant. What are the risks? Your health care provider will talk with you about risks. These may include: The symptoms of GERD coming back. Trouble swallowing. Not being able to burp. This can cause bloating. The surgery not working. A hole in your esophagus or stomach. Damage to a lung or nearby structures. These include the nerves, blood vessels, liver, or spleen. Bleeding or infection. Allergic reactions to medicines. What happens before? When to stop eating and drinking Eat and drink only as you've been told. You may be told this: 8 hours before your surgery Stop eating most foods. Do not eat meat, fried foods, or fatty foods. Eat only light foods, such as toast or crackers. All liquids are OK except energy drinks and alcohol. 6 hours before your surgery Stop eating. Drink only clear liquids, such as water , clear fruit juice,  black coffee, plain tea, and sports drinks. Do not drink energy drinks or alcohol. 2 hours before your surgery Stop drinking all liquids. You may be allowed to take medicines with small sips of water . If you do not eat and drink as told, your surgery may be delayed or canceled. Medicines Ask about changing or stopping: Any medicines you take. Any vitamins, herbs, or supplements you take. Do not take aspirin or ibuprofen unless you're told to. Tests You'll need tests. These may include: Barium swallow X-rays. These are done to take pictures of your throat, stomach, and small intestine. An exam using a scope put down your esophagus into your stomach. A test to measures the pressure in your throat when you swallow. A test using a probe to look for acid in your esophagus. Surgery safety For your safety, you may: Need to wash your skin with a soap that kills germs. Get antibiotics. Have your surgery site marked. Have hair removed at the surgery site. General instructions Do not smoke, vape, or use nicotine or tobacco for at least 4 weeks before the surgery. Ask if you'll be staying overnight in the hospital. If you'll be going home right after the surgery, plan to have a responsible adult: Drive you home from the hospital or clinic. You won't be allowed to drive. Stay with you for the time you're told. What happens during open Nissen fundoplication? An IV will be put into a vein in your hand or arm. You may be given: A sedative to help you relax. Anesthesia to keep you from feeling pain. A cut will be made in your upper  belly. The upper part of your stomach will be wrapped and stitched around the lower end of your esophagus. This will strengthen the muscle and prevent reflux. If you have a hiatal hernia, it will also be fixed. A hernia is a bulge of tissue from inside your belly that pushes through a weak area of your belly. The cut in your belly will be closed with stitches or  staples. A bandage will be put over the cut. These steps may vary. Ask what you can expect. What happens after? You'll be watched closely until you leave. This includes checking your pain level, blood pressure, heart rate, and breathing rate. Your IV will be kept in until you can drink on your own. This information is not intended to replace advice given to you by your health care provider. Make sure you discuss any questions you have with your health care provider. Document Revised: 08/04/2023 Document Reviewed: 08/04/2023 Elsevier Patient Education  2024 [employer]  Surgery to Stop Reflux (Open Nissen Fundoplication): What to Know After After having a surgery called open Nissen fundoplication to stop reflux, it's common to have pain when you swallow. You may also have: Trouble swallowing. Soreness in your belly or chest. Bloating. Follow these instructions at home: Medicines Take your medicines only as told. Ask your health care provider if you can crush any pill that you need to take. Take only liquid medicines as told. Ask your provider if it's safe to drive or use machines while taking your medicine. Eating and drinking Eat and drink as told. You may need to: Only have liquids for 2 weeks. After that, you may need to only have soft foods for 2 weeks. Eat slowly. Take small bites. Chew your food well. Eat or drink only while upright. Go back to your normal diet slowly. Drink more fluids as told. Caring for your cut from surgery  Take care of your cut from surgery as told. Make sure you: Wash your hands with soap and water  for at least 20 seconds before and after you change your bandage. If you can't use soap and water , use hand sanitizer. Change your bandage. Leave stitches or skin glue alone. Leave tape strips alone unless you're told to take them off. You may trim the edges of the tape strips if they curl up. Check the area around your cut every day for signs of  infection. Check for: More redness, swelling, or pain. More fluid or blood. Warmth. Pus or a bad smell. Activity If you were given a sedative, do not drive or use machines until you're told it's safe. A sedative can make you sleepy. Rest as told. Get up to take short walks at least every 2 hours many times during the day. This helps you breathe better and keeps your blood flowing. Ask for help if you feel weak or unsteady. Do not lift anything heavier than 10 lb (4.5 kg) until you're told it's OK. This may be after about 6 weeks. Try not to do things that take a lot of effort. Ask when you can have sex again. Ask what things are safe for you to do at home. Ask when you can go back to work or school. General instructions Do not take baths, swim, or use a hot tub until you're told it's OK. Ask if you can shower. Do not smoke, vape, or use nicotine or tobacco. Doing this can slow down healing. Your provider may give you more instructions. Make sure you know what  you can and can't do. Contact a health care provider if: You have chills or a fever. You have more trouble swallowing. You have painful bloating. You have heartburn that won't go away. Your pain doesn't get better with medicine. You throw up a lot or often feel like you may throw up. Your cut from surgery opens up. You have any signs of infection. Get help right away if: You have signs of a perforation. These include: Very bad pain. Throwing up and not being able to stop. A fever. A heart beat that's too fast. You have very bad pain or bloating. You keep throwing up and can't stop. You have blood in your throw up. You have chest pain or trouble breathing. These symptoms may be an emergency. Call 911 right away. Do not wait to see if the symptoms will go away. Do not drive yourself to the hospital. This information is not intended to replace advice given to you by your health care provider. Make sure you discuss any  questions you have with your health care provider. Document Revised: 08/04/2023 Document Reviewed: 08/04/2023 Elsevier Patient Education  2024 [employer].

## 2024-02-03 NOTE — Plan of Care (Signed)
 Progressing towards discharge

## 2024-02-03 NOTE — Discharge Summary (Signed)
 Patient ID: Joyce Stevenson MRN: [id number] DOB/AGE: [birth year] 58 y.o.  Admit date: 02/02/2024 Discharge date: 02/03/2024   Discharge Diagnoses:  Principal Problem:   S/P repair of paraesophageal hernia Active Problems:   Hiatal hernia with GERD   Procedures:  Hospital Course: 58 year old female with recalcitrant reflux came in for elective hiatal hernia repair Nissen fundoplication robotically.  Procedure was uneventful but difficult patient was kept overnight. He did have a chest x-ray in PACU showing a questionable possible small apical pneumothorax in the left side however the patient was completely asymptomatic and we decided to watch her.  These never posed any issues  Patient was kept overnight.  At The time of discharge the patient was ambulating,  pain was controlled.  Her vital signs were stable and she was afebrile.   physical exam at discharge showed a pt  in no acute distress.  Awake and alert.  Chest: CTA, no crepitus, no sub q air and well aerated. Abdomen: Soft incisions healing well without infection or peritonitis.  Extremities well-perfused and no edema.  Condition of the patient the time of discharge was stable    Disposition: Discharge disposition: 01-Home or Self Care       Discharge Instructions     Call MD for:  difficulty breathing, headache or visual disturbances   Complete by: As directed    Call MD for:  extreme fatigue   Complete by: As directed    Call MD for:  hives   Complete by: As directed    Call MD for:  persistant dizziness or light-headedness   Complete by: As directed    Call MD for:  persistant nausea and vomiting   Complete by: As directed    Call MD for:  redness, tenderness, or signs of infection (pain, swelling, redness, odor or green/yellow discharge around incision site)   Complete by: As directed    Call MD for:  severe uncontrolled pain   Complete by: As directed    Call MD for:  temperature >100.4   Complete by: As  directed    Diet - low sodium heart healthy   Complete by: As directed    Nissen diet   Discharge instructions   Complete by: As directed    Shower tomorrow   Increase activity slowly   Complete by: As directed    Lifting restrictions   Complete by: As directed    20 lbs x 6 wks      Allergies as of 02/03/2024   No Known Allergies      Medication List     STOP taking these medications    acetaminophen  500 MG tablet Commonly known as: TYLENOL    famotidine  40 MG tablet Commonly known as: PEPCID        TAKE these medications    Airsupra 90-80 MCG/ACT Aero Generic drug: Albuterol -Budesonide Inhale 1-2 puffs into the lungs every 6 (six) hours as needed (asthma).   Benefiber Powd Take 1 Scoop by mouth daily at 6 (six) AM.   diphenhydrAMINE  25 MG tablet Commonly known as: BENADRYL  Take 25 mg by mouth at bedtime as needed for allergies.   ibuprofen 200 MG tablet Commonly known as: ADVIL Take 400 mg by mouth every 8 (eight) hours as needed (pain/headaches.).   MAGNESIUM PO Take 1 capsule by mouth at bedtime.   multivitamin with minerals Tabs tablet Take 1 tablet by mouth in the morning.   ondansetron  4 MG tablet Commonly known as: Zofran  Take 1 tablet (4 mg total)  by mouth every 6 (six) hours as needed for nausea or vomiting.   oxyCODONE  5 MG immediate release tablet Commonly known as: Oxy IR/ROXICODONE  Take 1 tablet (5 mg total) by mouth every 4 (four) hours as needed for severe pain (pain score 7-10).   POTASSIUM PO Take 1 tablet by mouth in the morning.   TYLENOL  SINUS+HEADACHE PO Take 2 tablets by mouth daily as needed (congestion).   VITAMIN D PO Take 1 capsule by mouth in the morning.        Follow-up Information     [place], [place], MD Follow up on 02/15/2024.   Specialty: General Surgery Contact information: [address] [place] [address] [phone]                  Laneta Luna, MD FACS

## 2024-02-14 ENCOUNTER — Telehealth: Payer: Self-pay | Admitting: Surgery

## 2024-02-14 NOTE — Telephone Encounter (Signed)
 Patient had robotic paraesophageal hernia repair done 02/02/24 with Dr. Everlene Farrier.  Patient states that her employer is requesting for her to have a work note to return back to work.  Needs work note to be effective 02/13/24 to return to work, with lifting restrictions of no more than 10 lbs which she states the type of work she does, that shouldn't be a problem.  She wants to come by and pick up, please call her when ready.  Thank you.

## 2024-02-14 NOTE — Telephone Encounter (Signed)
 Patient notified that her work note is ready for pick up at the front desk.

## 2024-02-15 ENCOUNTER — Encounter: Payer: BC Managed Care – PPO | Admitting: Surgery

## 2024-02-20 ENCOUNTER — Ambulatory Visit (INDEPENDENT_AMBULATORY_CARE_PROVIDER_SITE_OTHER): Payer: BC Managed Care – PPO | Admitting: Surgery

## 2024-02-20 ENCOUNTER — Encounter: Payer: Self-pay | Admitting: Surgery

## 2024-02-20 VITALS — BP 139/79 | HR 76 | Temp 98.1°F | Ht 66.0 in | Wt 194.0 lb

## 2024-02-20 DIAGNOSIS — Z09 Encounter for follow-up examination after completed treatment for conditions other than malignant neoplasm: Secondary | ICD-10-CM

## 2024-02-20 DIAGNOSIS — K449 Diaphragmatic hernia without obstruction or gangrene: Secondary | ICD-10-CM

## 2024-02-20 NOTE — Patient Instructions (Signed)
 Follow up in 3 months.   Surgery to Stop Reflux (Laparoscopic Nissen Fundoplication): What to Expect  Laparoscopic Nissen fundoplication is a surgery to help with symptoms of gastroesophageal reflux disease (GERD). In GERD, stomach contents and acid back up into your esophagus. Your esophagus is the part of your body that moves food from your mouth to your stomach. Normally, the muscle between your stomach and esophagus keeps stomach contents and acid in your stomach. If you have GERD, that muscle doesn't work like it should. This means stomach contents and acid may flow back up (reflux). You may need this surgery if other treatments for GERD haven't helped. In this surgery, the upper part of your stomach is wrapped around the lower end of your esophagus and stitched together. Tell a health care provider about: Any allergies you have. All medicines you take. These include vitamins, herbs, eye drops, and creams. Any problems you or family members have had with anesthesia. Any bleeding problems you have. Any surgeries you've had. Any medical problems you have. Whether you're pregnant or may be pregnant. What are the risks? Your health care provider will talk with you about risks. These may include: Infection. Bleeding or blood clots. Damage to other structures or organs. These include the stomach, esophagus, lungs, liver, and spleen. Trouble swallowing. Not being able to burp. This can cause bloating. Allergic reactions to medicines. Other risks may include: A tear in the stitches used for the wrap. A hole in your lung. A hole in your esophagus or stomach. What happens before? When to stop eating and drinking Eat and drink only as you've been told. You may be told this: 8 hours before your surgery Stop eating most foods. Do not eat meat, fried foods, or fatty foods. Eat only light foods, such as toast or crackers. All liquids are OK except energy drinks and alcohol. 6 hours before  your surgery Stop eating. Drink only clear liquids, such as water, clear fruit juice, black coffee, plain tea, and sports drinks. Do not drink energy drinks or alcohol. 2 hours before your surgery Stop drinking all liquids. You may be allowed to take medicines with small sips of water. If you do not eat and drink as told, your surgery may be delayed or canceled. Medicines Ask about changing or stopping: Any medicines you take. Any vitamins, herbs, or supplements you take. Do not take aspirin or ibuprofen unless you're told to. Tests You'll need tests. These may include: Barium swallow X-rays. These are done to take pictures of your throat, stomach, and small intestine. An exam using a scope put down your esophagus into your stomach. A test to measures the pressure in your throat when you swallow. A test using a probe to look for acid in your esophagus. Surgery safety For your safety, you may: Need to wash your skin with a soap that kills germs. Get antibiotics. Have your surgery site marked. Have hair removed at the surgery site. General instructions Do not smoke, vape, or use nicotine or tobacco for at least 4 weeks before the surgery. Ask if you'll be staying overnight in the hospital. If you'll be going home right after the surgery, plan to have a responsible adult: Drive you home from the hospital or clinic. You won't be allowed to drive. Stay with you for the time you're told. What happens during laparoscopic Nissen fundoplication? An IV will be put into a vein in your hand or arm. You may be given: A sedative to help you  relax. Anesthesia to keep you from feeling pain. A small cut will be made in your belly. A thin, lighted tube called a laparoscope will be put into the cut. The tube has a camera on the end of it to help your surgeon see inside your stomach. A few more small cuts will be made in your belly. Tools will be put through the cuts. The upper part of your stomach  will be wrapped and stitched around the lower end of your esophagus. This will strengthen the muscle and prevent reflux. If you have a hiatal hernia, it will also be fixed. A hernia is a bulge of tissue from inside your belly that pushes through a weak area of your belly. The tools will be taken out. The cuts will be closed with stitches. A bandage will be put over the cuts. These steps may vary. Ask what you can expect. What happens after? You'll be watched closely until you leave. This includes checking your pain level, blood pressure, heart rate, and breathing rate. Your IV will be kept in until you can drink on your own. This information is not intended to replace advice given to you by your health care provider. Make sure you discuss any questions you have with your health care provider. Document Revised: 08/04/2023 Document Reviewed: 08/04/2023 Elsevier Patient Education  2024 [employer].

## 2024-02-20 NOTE — Progress Notes (Signed)
 Joyce Stevenson is 2 weeks out from Nissen fundoplication.  She is doing well no evidence of dysphagia no evidence of reflux.  She had some occasional cough that is different.  She does have an appointment with GI for further workup of cirrhosis. He has been compliant with diet.  PE NAD Abd: soft, nt incisions c/di  A/p Doing very well without evidence of complications.  I will see her back in a couple months.  Discussed with her about continuation of Nissen diet for couple weeks and introduction of new meals slowly.

## 2024-04-30 ENCOUNTER — Ambulatory Visit (INDEPENDENT_AMBULATORY_CARE_PROVIDER_SITE_OTHER): Admitting: Surgery

## 2024-04-30 ENCOUNTER — Encounter: Payer: Self-pay | Admitting: Surgery

## 2024-04-30 VITALS — BP 131/76 | HR 63 | Temp 97.8°F | Ht 66.0 in | Wt 185.4 lb

## 2024-04-30 DIAGNOSIS — K7469 Other cirrhosis of liver: Secondary | ICD-10-CM | POA: Diagnosis not present

## 2024-04-30 NOTE — Patient Instructions (Signed)
 Surgery to Stop Reflux (Laparoscopic Nissen Fundoplication): What to Know After After having a surgery called laparoscopic Nissen fundoplication, it's common to have pain when you swallow. You may also have: Trouble swallowing. Soreness in your belly or chest. Bloating. Follow these instructions at home: Medicines Take your medicines only as told. Ask your health care provider if you can crush any pill that you need to take. Take only liquid medicines as told. Ask your provider if it's safe to drive or use machines while taking your medicine. Eating and drinking Eat and drink as told. You may need to: Only have liquids for 2 weeks. After that, you may need to only have soft foods for 2 weeks. Eat slowly. Take small bites. Chew your food well. Eat or drink only while upright. Go back to your normal diet slowly. Drink more fluids as told. Caring for your cuts from surgery  Take care of your cuts from surgery as told. Make sure you: Wash your hands with soap and water for at least 20 seconds before and after you change your bandage. If you can't use soap and water, use hand sanitizer. Change your bandage. Leave stitches or skin glue alone. Leave tape strips alone unless you're told to take them off. You may trim the edges of the tape strips if they curl up. Check the area around your cuts every day for signs of infection. Check for: More redness, swelling, or pain. More fluid or blood. Warmth. Pus or a bad smell. Activity If you were given a sedative, do not drive or use machines until you're told it's safe. A sedative can make you sleepy. Rest as told. Get up to take short walks at least every 2 hours many times during the day. This helps you breathe better and keeps your blood flowing. Ask for help if you feel weak or unsteady. Do not lift anything heavier than 10 lb (4.5 kg) until you're told it's OK. This may be after about 6 weeks. Try not to do things that take a lot of  effort. Ask when you can have sex again. Ask what things are safe for you to do at home. Ask when you can go back to work or school. General instructions Do not take baths, swim, or use a hot tub until you're told it's OK. Ask if you can shower. Do not smoke, vape, or use nicotine or tobacco. Doing this can slow down healing. Your provider may give you more instructions. Make sure you know what you can and can't do. Contact a health care provider if: You have chills or a fever. You have more trouble swallowing. You have painful bloating. You have heartburn that won't go away. Your pain doesn't get better with medicine. You throw up a lot or often feel like you may throw up. A cut from surgery opens up. You have any signs of infection. Get help right away if: You have signs of a perforation. These include: Very bad pain. Throwing up and not being able to stop. A fever. A heart beat that's too fast. You have very bad pain or bloating. You keep throwing up and can't stop. You have blood in your throw up. You have chest pain or trouble breathing. These symptoms may be an emergency. Call 911 right away. Do not wait to see if the symptoms will go away. Do not drive yourself to the hospital. This information is not intended to replace advice given to you by your health care provider. Make  sure you discuss any questions you have with your health care provider. Document Revised: 08/04/2023 Document Reviewed: 08/04/2023 Elsevier Patient Education  2024 [employer].

## 2024-05-01 NOTE — Progress Notes (Signed)
 Outpatient Surgical Follow Up   Joyce Stevenson is an 58 y.o. female.   Chief Complaint  Patient presents with   Routine Post Op    Paraesophageal hernia repair 02/02/2024    HPI: Joyce Stevenson is 3 months out from robotic paraesophageal.  We found incidentally serous serous.  Appropriate workup was CMP.  It was normal significant issues including her LFTs.  I did send her to GI clinic and is subsequently referred to hepatologist at [facility]. No evidence of hepatic decompensation or encephalopathy, TOday she wanted to focus on her cirrhosis. She has no reflux, tolerating meals w/o dysphagia. Cough improved some. I have pers, reviewed her CT showing HH but no cirrhosis, she did have  elastography by GI confirming cirrhosis but mild state. SHe is CHild's A.   Past Medical History:  Diagnosis Date   Asthma    Chronic cough    GERD (gastroesophageal reflux disease)    History of hiatal hernia    Hyperlipidemia    Obesity    Uterine fibroid     Past Surgical History:  Procedure Laterality Date   BILATERAL SALPINGECTOMY Bilateral 07/15/2021   Procedure: BILATERAL SALPINGECTOMY;  Surgeon: Schermerhorn, Joselyn Nicely, MD;  Location: [facility];  Service: Gynecology;  Laterality: Bilateral;   COLONOSCOPY  09/2017   COLONOSCOPY WITH ESOPHAGOGASTRODUODENOSCOPY (EGD)  12/2016   INSERTION OF MESH N/A 02/02/2024   Procedure: INSERTION OF MESH;  Surgeon: Alben Alma, MD;  Location: [facility];  Service: General;  Laterality: N/A;   VAGINAL HYSTERECTOMY N/A 07/15/2021   Procedure: HYSTERECTOMY VAGINAL;  Surgeon: Carolynn Citrin, MD;  Location: [facility];  Service: Gynecology;  Laterality: N/A;   XI ROBOTIC ASSISTED PARAESOPHAGEAL HERNIA REPAIR N/A 02/02/2024   Procedure: XI ROBOTIC ASSISTED PARAESOPHAGEAL HERNIA REPAIR, RNFA to assist;  Surgeon: Alben Alma, MD;  Location: [facility];  Service: General;  Laterality: N/A;    Family History  Problem Relation Age of Onset   Breast cancer Maternal Aunt      Social History:  reports that she has never smoked. She has never been exposed to tobacco smoke. She has never used smokeless tobacco. She reports current alcohol use. She reports that she does not use drugs.  Allergies: No Known Allergies  Medications reviewed.    ROS Full ROS performed and is otherwise negative other than what is stated in HPI   BP 131/76   Pulse 63   Temp 97.8 F (36.6 C) (Oral)   Ht 5\' 6"  (1.676 m)   Wt 185 lb 6.4 oz (84.1 kg)   LMP 03/02/2017   SpO2 99%   BMI 29.92 kg/m   Physical Exam Vitals and nursing note reviewed.  Constitutional:      General: She is not in acute distress.    Appearance: Normal appearance.  Pulmonary:     Effort: Pulmonary effort is normal. No respiratory distress.     Breath sounds: No stridor.  Abdominal:     General: Abdomen is flat. There is no distension.     Palpations: Abdomen is soft. There is no mass.     Tenderness: There is no abdominal tenderness. There is no guarding.     Hernia: No hernia is present.  Skin:    General: Skin is warm and dry.     Capillary Refill: Capillary refill takes less than 2 seconds.  Neurological:     General: No focal deficit present.     Mental Status: She is alert and oriented to person, place,  and time.  Psychiatric:        Mood and Affect: Mood normal.        Behavior: Behavior normal.        Thought Content: Thought content normal.        Judgment: Judgment normal.      Assessment/Plan: Joyce Stevenson is doing very well after robotic paraesophageal hernia.  The visit today focused on her cirrhosis and ways to order prevent more hepatic injury.  Discussed with her about avoidance of alcohol decreasing the dose of Tylenol  as well as diet and exercise.  From a surgical perspective there are no surgical complications or issues at this time. The evaluation and management of this service ( cirrhosis) was performed during the postoperative period of an unrelated procedure.   I spent  30 minutes in this encounter including extensive review of medical records, personally reviewing imaging studies, coordinating her care, placing orders and performing appropriate documentation   Evelia Hipp, MD [facility] General Surgeon

## 2024-07-18 ENCOUNTER — Other Ambulatory Visit: Payer: Self-pay | Admitting: Family Medicine

## 2024-07-18 DIAGNOSIS — K769 Liver disease, unspecified: Secondary | ICD-10-CM

## 2024-07-24 ENCOUNTER — Ambulatory Visit

## 2024-08-06 ENCOUNTER — Ambulatory Visit
Admission: EM | Admit: 2024-08-06 | Discharge: 2024-08-06 | Disposition: A | Discharge status: Home / Self Care | Attending: Emergency Medicine | Admitting: Emergency Medicine

## 2024-08-06 ENCOUNTER — Encounter: Payer: Self-pay | Admitting: Emergency Medicine

## 2024-08-06 DIAGNOSIS — R319 Hematuria, unspecified: Secondary | ICD-10-CM | POA: Insufficient documentation

## 2024-08-06 DIAGNOSIS — N390 Urinary tract infection, site not specified: Secondary | ICD-10-CM

## 2024-08-06 LAB — URINALYSIS, W/ REFLEX TO CULTURE (INFECTION SUSPECTED)
Bilirubin Urine: NEGATIVE
Glucose, UA: NEGATIVE mg/dL
Ketones, ur: NEGATIVE mg/dL
Nitrite: NEGATIVE
Protein, ur: 100 mg/dL — AB
RBC / HPF: >50 RBC/hpf (ref 0–5)
Specific Gravity, Urine: 1.015 (ref 1.005–1.030)
pH: 5.5 (ref 5.0–8.0)

## 2024-08-06 MED ORDER — FLUCONAZOLE 150 MG PO TABS: ORAL_TABLET | ORAL | 0 refills | Status: AC

## 2024-08-06 MED ORDER — CEPHALEXIN 500 MG PO CAPS: 500.0000 mg | ORAL_CAPSULE | Freq: Four times a day (QID) | ORAL | 0 refills | Status: AC

## 2024-08-06 NOTE — Discharge Instructions (Addendum)
 Drink plenty of water , avoid caffeine Take antibiotic as directed Diflucan  as prescribed Follow up with PCP for recheck, sooner if worse(fever, nausea,vomiting, etc), if symptoms persist or reoccur may need referral to urology for further evaluation.

## 2024-08-06 NOTE — ED Provider Notes (Signed)
 [facility]    CSN: [id number] Arrival date & time: 08/06/24  9191      History   Chief Complaint Chief Complaint  Patient presents with   Hematuria   Urinary Frequency    HPI Joyce Stevenson is a 58 y.o. female.   58 year old female, Joyce Stevenson, presents to urgent care for evaluation of bladder pressure, noticed blood in urine this am, no treatment for symptoms taken by pt yet. Pt is afebrile. Pt denies nausea,vomiting, diarrhea, no back pain.  PMH: Asthma, GERD, hyperlipidemia, obesity  The history is provided by the patient. No language interpreter was used.  Urinary Frequency    Past Medical History:  Diagnosis Date   Asthma    Chronic cough    GERD (gastroesophageal reflux disease)    History of hiatal hernia    Hyperlipidemia    Obesity    Uterine fibroid     Patient Active Problem List   Diagnosis Date Noted   Urinary tract infection with hematuria 08/06/2024   S/P repair of paraesophageal hernia 02/02/2024   Hiatal hernia with GERD 02/02/2024    Past Surgical History:  Procedure Laterality Date   BILATERAL SALPINGECTOMY Bilateral 07/15/2021   Procedure: BILATERAL SALPINGECTOMY;  Surgeon: Schermerhorn, Debby PARAS, MD;  Location: [facility];  Service: Gynecology;  Laterality: Bilateral;   COLONOSCOPY  09/2017   COLONOSCOPY WITH ESOPHAGOGASTRODUODENOSCOPY (EGD)  12/2016   INSERTION OF MESH N/A 02/02/2024   Procedure: INSERTION OF MESH;  Surgeon: Jordis Laneta FALCON, MD;  Location: [facility];  Service: General;  Laterality: N/A;   VAGINAL HYSTERECTOMY N/A 07/15/2021   Procedure: HYSTERECTOMY VAGINAL;  Surgeon: Lovetta Debby PARAS, MD;  Location: [facility];  Service: Gynecology;  Laterality: N/A;   XI ROBOTIC ASSISTED PARAESOPHAGEAL HERNIA REPAIR N/A 02/02/2024   Procedure: XI ROBOTIC ASSISTED PARAESOPHAGEAL HERNIA REPAIR, RNFA to assist;  Surgeon: Jordis Laneta FALCON, MD;  Location: [facility];  Service: General;  Laterality: N/A;    OB History   No  obstetric history on file.      Home Medications    Prior to Admission medications   Medication Sig Start Date End Date Taking? Authorizing Provider  cephALEXin  (KEFLEX ) 500 MG capsule Take 1 capsule (500 mg total) by mouth 4 (four) times daily for 7 days. 08/06/24 08/13/24 Yes Adler Chartrand, NP  fluconazole  (DIFLUCAN ) 150 MG tablet Take 1 tab po day 3, repeat day 7 08/06/24  Yes Marylou Wages, Rilla, NP  Albuterol -Budesonide (AIRSUPRA) 90-80 MCG/ACT AERO Inhale 1-2 puffs into the lungs every 6 (six) hours as needed (asthma).    [provider]  diphenhydrAMINE  (BENADRYL ) 25 MG tablet Take 25 mg by mouth at bedtime as needed for allergies.    [provider]  ibuprofen (ADVIL) 200 MG tablet Take 400 mg by mouth every 8 (eight) hours as needed (pain/headaches.).    [provider]  Multiple Vitamin (MULTIVITAMIN WITH MINERALS) TABS tablet Take 1 tablet by mouth in the morning.    [provider]  Wheat Dextrin (BENEFIBER) POWD Take 1 Scoop by mouth daily at 6 (six) AM.    [provider]    Family History Family History  Problem Relation Age of Onset   Breast cancer Maternal Aunt     Social History Social History   Tobacco Use   Smoking status: Never    Passive exposure: Never   Smokeless tobacco: Never  Vaping Use   Vaping status: Never Used  Substance Use Topics   Alcohol  use: Yes    Comment: rare   Drug use: Never     Allergies   Patient has no known allergies.   Review of Systems Review of Systems  Constitutional:  Negative for fever.  Gastrointestinal:  Negative for diarrhea, nausea and vomiting.       Bladder pressure no pain  Genitourinary:  Positive for dysuria, frequency and hematuria.  Musculoskeletal:  Negative for back pain.  All other systems reviewed and are negative.    Physical Exam Triage Vital Signs ED Triage Vitals  Encounter Vitals Group     BP 08/06/24 0832 133/79     Girls Systolic BP  Percentile --      Girls Diastolic BP Percentile --      Boys Systolic BP Percentile --      Boys Diastolic BP Percentile --      Pulse Rate 08/06/24 0832 89     Resp 08/06/24 0832 16     Temp 08/06/24 0832 98.4 F (36.9 C)     Temp Source 08/06/24 0832 Oral     SpO2 08/06/24 0832 97 %     Weight 08/06/24 0829 182 lb (82.6 kg)     Height --      Head Circumference --      Peak Flow --      Pain Score 08/06/24 0831 0     Pain Loc --      Pain Education --      Exclude from Growth Chart --    No data found.  Updated Vital Signs BP 133/79 (BP Location: Right Arm)   Pulse 89   Temp 98.4 F (36.9 C) (Oral)   Resp 16   Wt 182 lb (82.6 kg)   LMP 03/02/2017   SpO2 97%   BMI 29.38 kg/m   Visual Acuity Right Eye Distance:   Left Eye Distance:   Bilateral Distance:    Right Eye Near:   Left Eye Near:    Bilateral Near:     Physical Exam Vitals and nursing note reviewed.  Constitutional:      General: She is not in acute distress.    Appearance: She is well-developed and well-groomed.  HENT:     Head: Normocephalic and atraumatic.  Eyes:     Conjunctiva/sclera: Conjunctivae normal.  Cardiovascular:     Rate and Rhythm: Normal rate.     Heart sounds: No murmur heard. Pulmonary:     Effort: Pulmonary effort is normal. No respiratory distress.  Abdominal:     Palpations: Abdomen is soft.     Tenderness: There is no abdominal tenderness.  Musculoskeletal:        General: No swelling.     Cervical back: Neck supple.  Skin:    General: Skin is warm and dry.     Capillary Refill: Capillary refill takes less than 2 seconds.  Neurological:     General: No focal deficit present.     Mental Status: She is alert and oriented to person, place, and time.     GCS: GCS eye subscore is 4. GCS verbal subscore is 5. GCS motor subscore is 6.  Psychiatric:        Attention and Perception: Attention normal.        Mood and Affect: Mood normal.        Speech: Speech normal.         Behavior: Behavior normal. Behavior is cooperative.      UC Treatments / Results  Labs (all labs ordered are listed, but only abnormal results are displayed) Labs Reviewed  URINALYSIS, W/ REFLEX TO CULTURE (INFECTION SUSPECTED) - Abnormal; Notable for the following components:      Result Value   Color, Urine AMBER (*)    APPearance HAZY (*)    Hgb urine dipstick LARGE (*)    Protein, ur 100 (*)    Leukocytes,Ua MODERATE (*)    Bacteria, UA MANY (*)    All other components within normal limits  URINE CULTURE    EKG   Radiology No results found.  Procedures Procedures (including critical care time)  Medications Ordered in UC Medications - No data to display  Initial Impression / Assessment and Plan / UC Course  I have reviewed the triage vital signs and the nursing notes.  Pertinent labs & imaging results that were available during my care of the patient were reviewed by me and considered in my medical decision making (see chart for details).  Clinical Course as of 08/06/24 0920  Mon Aug 06, 2024  9148 07/04/2024 CMP normal, pt requesting diflucan  if + UTI and needs antibiotic, NKDA. [JD]  [id number] UA shows : hazy urine, large hgb, 100 protein, moderate leuks, 21-50 wbc, many bacteria.  Will treat for UTi w hematuria, scripting:keflex  500 qid x 7 days, will also script diflucan , urine culture pending. Discussed exam findings and plan of care with pt, strict got to ER precautions given.  [JD]    Clinical Course User Index [JD] Leitha Hyppolite, Rilla, NP   Discussed exam findings and plan of care with patient, urine culture pending, if culture is not sensitive to Keflex  will need to change med and notify pt, strict go to ER precautions given.   Patient verbalized understanding to this provider.  Ddx: UTi,hematuria, pyelonpehritis, kidney stone Final Clinical Impressions(s) / UC Diagnoses   Final diagnoses:  Urinary tract infection with hematuria, site unspecified      Discharge Instructions      Drink plenty of water , avoid caffeine Take antibiotic as directed Diflucan  as prescribed Follow up with PCP for recheck, sooner if worse(fever, nausea,vomiting, etc), if symptoms persist or reoccur may need referral to urology for further evaluation.     ED Prescriptions     Medication Sig Dispense Auth. Provider   cephALEXin  (KEFLEX ) 500 MG capsule Take 1 capsule (500 mg total) by mouth 4 (four) times daily for 7 days. 28 capsule Matilynn Dacey, NP   fluconazole  (DIFLUCAN ) 150 MG tablet Take 1 tab po day 3, repeat day 7 2 tablet Keeya Dyckman, NP      PDMP not reviewed this encounter.   Aminta Rilla, NP 08/06/24 [phone]

## 2024-08-06 NOTE — ED Triage Notes (Signed)
 Pt c/o pressure in her bladder, urinary frequency since yesterday. Pt noticed blood in her urine this morning. She has not taken anything for her symptoms.

## 2024-08-08 ENCOUNTER — Ambulatory Visit (HOSPITAL_COMMUNITY): Payer: Self-pay

## 2024-08-08 LAB — URINE CULTURE: Culture: 20000 — AB

## 2024-08-23 ENCOUNTER — Ambulatory Visit: Admitting: Dietician

## 2024-09-19 ENCOUNTER — Other Ambulatory Visit: Payer: Self-pay | Admitting: Nurse Practitioner

## 2024-09-19 DIAGNOSIS — Z1231 Encounter for screening mammogram for malignant neoplasm of breast: Secondary | ICD-10-CM

## 2024-10-22 ENCOUNTER — Ambulatory Visit
Admission: RE | Admit: 2024-10-22 | Discharge: 2024-10-22 | Disposition: A | Discharge status: Home / Self Care | Source: Ambulatory Visit | Attending: Nurse Practitioner | Admitting: Nurse Practitioner

## 2024-10-22 DIAGNOSIS — Z1231 Encounter for screening mammogram for malignant neoplasm of breast: Secondary | ICD-10-CM | POA: Diagnosis present
